# Patient Record
Sex: Male | Born: 1935 | Race: Black or African American | Hispanic: No | Marital: Married | Smoking: Former smoker
Health system: Southern US, Community
[De-identification: ages and names within clinical notes are randomized; demographics above are authoritative.]

## PROBLEM LIST (undated history)

## (undated) DIAGNOSIS — N400 Enlarged prostate without lower urinary tract symptoms: Secondary | ICD-10-CM

## (undated) DIAGNOSIS — Z9109 Other allergy status, other than to drugs and biological substances: Secondary | ICD-10-CM

## (undated) DIAGNOSIS — I10 Essential (primary) hypertension: Secondary | ICD-10-CM

## (undated) HISTORY — PX: CHOLECYSTECTOMY: SHX55

---

## 2004-12-27 ENCOUNTER — Emergency Department: Payer: Self-pay | Admitting: Emergency Medicine

## 2016-01-05 ENCOUNTER — Emergency Department: Payer: Medicare Other

## 2016-01-05 ENCOUNTER — Inpatient Hospital Stay
Admission: EM | Admit: 2016-01-05 | Discharge: 2016-01-15 | DRG: 280 | Disposition: A | Discharge status: Skilled Nursing Facility | Payer: Medicare Other | Attending: Internal Medicine | Admitting: Internal Medicine

## 2016-01-05 DIAGNOSIS — Z79899 Other long term (current) drug therapy: Secondary | ICD-10-CM

## 2016-01-05 DIAGNOSIS — N390 Urinary tract infection, site not specified: Secondary | ICD-10-CM | POA: Diagnosis not present

## 2016-01-05 DIAGNOSIS — I429 Cardiomyopathy, unspecified: Secondary | ICD-10-CM | POA: Diagnosis present

## 2016-01-05 DIAGNOSIS — I110 Hypertensive heart disease with heart failure: Secondary | ICD-10-CM | POA: Diagnosis present

## 2016-01-05 DIAGNOSIS — R748 Abnormal levels of other serum enzymes: Secondary | ICD-10-CM

## 2016-01-05 DIAGNOSIS — R042 Hemoptysis: Secondary | ICD-10-CM | POA: Diagnosis not present

## 2016-01-05 DIAGNOSIS — I472 Ventricular tachycardia, unspecified: Secondary | ICD-10-CM

## 2016-01-05 DIAGNOSIS — I5021 Acute systolic (congestive) heart failure: Secondary | ICD-10-CM | POA: Diagnosis present

## 2016-01-05 DIAGNOSIS — Z87891 Personal history of nicotine dependence: Secondary | ICD-10-CM | POA: Diagnosis not present

## 2016-01-05 DIAGNOSIS — N289 Disorder of kidney and ureter, unspecified: Secondary | ICD-10-CM | POA: Diagnosis not present

## 2016-01-05 DIAGNOSIS — Y731 Therapeutic (nonsurgical) and rehabilitative gastroenterology and urology devices associated with adverse incidents: Secondary | ICD-10-CM | POA: Diagnosis not present

## 2016-01-05 DIAGNOSIS — Z66 Do not resuscitate: Secondary | ICD-10-CM | POA: Diagnosis not present

## 2016-01-05 DIAGNOSIS — N189 Chronic kidney disease, unspecified: Secondary | ICD-10-CM

## 2016-01-05 DIAGNOSIS — Y846 Urinary catheterization as the cause of abnormal reaction of the patient, or of later complication, without mention of misadventure at the time of the procedure: Secondary | ICD-10-CM | POA: Diagnosis not present

## 2016-01-05 DIAGNOSIS — I214 Non-ST elevation (NSTEMI) myocardial infarction: Secondary | ICD-10-CM | POA: Diagnosis present

## 2016-01-05 DIAGNOSIS — N400 Enlarged prostate without lower urinary tract symptoms: Secondary | ICD-10-CM | POA: Diagnosis present

## 2016-01-05 DIAGNOSIS — E785 Hyperlipidemia, unspecified: Secondary | ICD-10-CM | POA: Diagnosis present

## 2016-01-05 DIAGNOSIS — I272 Other secondary pulmonary hypertension: Secondary | ICD-10-CM | POA: Diagnosis present

## 2016-01-05 DIAGNOSIS — D696 Thrombocytopenia, unspecified: Secondary | ICD-10-CM | POA: Diagnosis present

## 2016-01-05 DIAGNOSIS — J9601 Acute respiratory failure with hypoxia: Secondary | ICD-10-CM | POA: Diagnosis not present

## 2016-01-05 DIAGNOSIS — G9340 Encephalopathy, unspecified: Secondary | ICD-10-CM | POA: Diagnosis present

## 2016-01-05 DIAGNOSIS — R739 Hyperglycemia, unspecified: Secondary | ICD-10-CM | POA: Diagnosis present

## 2016-01-05 DIAGNOSIS — Y92009 Unspecified place in unspecified non-institutional (private) residence as the place of occurrence of the external cause: Secondary | ICD-10-CM

## 2016-01-05 DIAGNOSIS — I493 Ventricular premature depolarization: Secondary | ICD-10-CM | POA: Diagnosis present

## 2016-01-05 DIAGNOSIS — I4892 Unspecified atrial flutter: Secondary | ICD-10-CM | POA: Diagnosis present

## 2016-01-05 DIAGNOSIS — R310 Gross hematuria: Secondary | ICD-10-CM | POA: Diagnosis not present

## 2016-01-05 DIAGNOSIS — Z8249 Family history of ischemic heart disease and other diseases of the circulatory system: Secondary | ICD-10-CM

## 2016-01-05 DIAGNOSIS — N179 Acute kidney failure, unspecified: Secondary | ICD-10-CM

## 2016-01-05 DIAGNOSIS — E876 Hypokalemia: Secondary | ICD-10-CM | POA: Diagnosis present

## 2016-01-05 DIAGNOSIS — T502X5A Adverse effect of carbonic-anhydrase inhibitors, benzothiadiazides and other diuretics, initial encounter: Secondary | ICD-10-CM | POA: Diagnosis present

## 2016-01-05 DIAGNOSIS — R0603 Acute respiratory distress: Secondary | ICD-10-CM

## 2016-01-05 DIAGNOSIS — J9600 Acute respiratory failure, unspecified whether with hypoxia or hypercapnia: Secondary | ICD-10-CM | POA: Diagnosis present

## 2016-01-05 DIAGNOSIS — R0602 Shortness of breath: Secondary | ICD-10-CM

## 2016-01-05 DIAGNOSIS — I959 Hypotension, unspecified: Secondary | ICD-10-CM

## 2016-01-05 DIAGNOSIS — J849 Interstitial pulmonary disease, unspecified: Secondary | ICD-10-CM | POA: Diagnosis present

## 2016-01-05 HISTORY — DX: Other allergy status, other than to drugs and biological substances: Z91.09

## 2016-01-05 HISTORY — DX: Essential (primary) hypertension: I10

## 2016-01-05 HISTORY — DX: Benign prostatic hyperplasia without lower urinary tract symptoms: N40.0

## 2016-01-05 LAB — CBC
HEMATOCRIT: 35.4 % — AB (ref 40.0–52.0)
Hemoglobin: 11.4 g/dL — ABNORMAL LOW (ref 13.0–18.0)
MCH: 28.1 pg (ref 26.0–34.0)
MCHC: 32.1 g/dL (ref 32.0–36.0)
MCV: 87.5 fL (ref 80.0–100.0)
PLATELETS: 149 10*3/uL — AB (ref 150–440)
RBC: 4.05 MIL/uL — ABNORMAL LOW (ref 4.40–5.90)
RDW: 15.9 % — AB (ref 11.5–14.5)
WBC: 7.1 10*3/uL (ref 3.8–10.6)

## 2016-01-05 LAB — COMPREHENSIVE METABOLIC PANEL
ALT: 22 U/L (ref 17–63)
ANION GAP: 8 (ref 5–15)
AST: 24 U/L (ref 15–41)
Albumin: 3 g/dL — ABNORMAL LOW (ref 3.5–5.0)
Alkaline Phosphatase: 71 U/L (ref 38–126)
BILIRUBIN TOTAL: 0.8 mg/dL (ref 0.3–1.2)
BUN: 21 mg/dL — AB (ref 6–20)
CHLORIDE: 111 mmol/L (ref 101–111)
CO2: 20 mmol/L — ABNORMAL LOW (ref 22–32)
Calcium: 8.2 mg/dL — ABNORMAL LOW (ref 8.9–10.3)
Creatinine, Ser: 1.47 mg/dL — ABNORMAL HIGH (ref 0.61–1.24)
GFR, EST AFRICAN AMERICAN: 49 mL/min — AB (ref >60)
GFR, EST NON AFRICAN AMERICAN: 42 mL/min — AB (ref >60)
Glucose, Bld: 181 mg/dL — ABNORMAL HIGH (ref 65–99)
POTASSIUM: 2.9 mmol/L — AB (ref 3.5–5.1)
Sodium: 139 mmol/L (ref 135–145)
TOTAL PROTEIN: 6.6 g/dL (ref 6.5–8.1)

## 2016-01-05 LAB — HEPARIN LEVEL (UNFRACTIONATED): Heparin Unfractionated: 0.6 IU/mL (ref 0.30–0.70)

## 2016-01-05 LAB — BLOOD GAS, ARTERIAL
Acid-base deficit: 4.8 mmol/L — ABNORMAL HIGH (ref 0.0–2.0)
Allens test (pass/fail): POSITIVE — AB
BICARBONATE: 20.5 meq/L — AB (ref 21.0–28.0)
FIO2: 1
O2 Saturation: 89.4 %
PO2 ART: 61 mmHg — AB (ref 83.0–108.0)
Patient temperature: 37
pCO2 arterial: 38 mmHg (ref 32.0–48.0)
pH, Arterial: 7.34 — ABNORMAL LOW (ref 7.350–7.450)

## 2016-01-05 LAB — BASIC METABOLIC PANEL
ANION GAP: 7 (ref 5–15)
BUN: 24 mg/dL — ABNORMAL HIGH (ref 6–20)
CALCIUM: 8.2 mg/dL — AB (ref 8.9–10.3)
CO2: 20 mmol/L — ABNORMAL LOW (ref 22–32)
Chloride: 109 mmol/L (ref 101–111)
Creatinine, Ser: 1.87 mg/dL — ABNORMAL HIGH (ref 0.61–1.24)
GFR, EST AFRICAN AMERICAN: 36 mL/min — AB (ref >60)
GFR, EST NON AFRICAN AMERICAN: 31 mL/min — AB (ref >60)
Glucose, Bld: 155 mg/dL — ABNORMAL HIGH (ref 65–99)
Potassium: 4.7 mmol/L (ref 3.5–5.1)
SODIUM: 136 mmol/L (ref 135–145)

## 2016-01-05 LAB — MAGNESIUM
Magnesium: 1.9 mg/dL (ref 1.7–2.4)
Magnesium: 1.9 mg/dL (ref 1.7–2.4)

## 2016-01-05 LAB — APTT: APTT: 28 s (ref 24–36)

## 2016-01-05 LAB — TROPONIN I
TROPONIN I: 0.22 ng/mL — AB (ref <0.031)
TROPONIN I: 3.23 ng/mL — AB (ref <0.031)
Troponin I: 2.16 ng/mL — ABNORMAL HIGH (ref <0.031)

## 2016-01-05 LAB — MRSA PCR SCREENING: MRSA BY PCR: NEGATIVE

## 2016-01-05 LAB — TSH: TSH: 1.713 u[IU]/mL (ref 0.350–4.500)

## 2016-01-05 LAB — GLUCOSE, CAPILLARY: Glucose-Capillary: 144 mg/dL — ABNORMAL HIGH (ref 65–99)

## 2016-01-05 LAB — BRAIN NATRIURETIC PEPTIDE: B NATRIURETIC PEPTIDE 5: 1339 pg/mL — AB (ref 0.0–100.0)

## 2016-01-05 LAB — PROTIME-INR
INR: 1.21
PROTHROMBIN TIME: 15.5 s — AB (ref 11.4–15.0)

## 2016-01-05 MED ORDER — SODIUM CHLORIDE 0.9% FLUSH
3.0000 mL | Freq: Two times a day (BID) | INTRAVENOUS | Status: DC
  Administered 2016-01-05 – 2016-01-15 (×17): 3 mL via INTRAVENOUS

## 2016-01-05 MED ORDER — ACETAMINOPHEN 650 MG RE SUPP: 650.0000 mg | Freq: Four times a day (QID) | RECTAL | Status: DC | PRN

## 2016-01-05 MED ORDER — HEPARIN (PORCINE) IN NACL 100-0.45 UNIT/ML-% IJ SOLN
1000.0000 [IU]/h | INTRAMUSCULAR | Status: DC
  Administered 2016-01-05 – 2016-01-06 (×2): 1000 [IU]/h via INTRAVENOUS
  Filled 2016-01-05 (×5): qty 250

## 2016-01-05 MED ORDER — AMIODARONE HCL IN DEXTROSE 360-4.14 MG/200ML-% IV SOLN
30.0000 mg/h | INTRAVENOUS | Status: DC
  Administered 2016-01-05 – 2016-01-09 (×8): 30 mg/h via INTRAVENOUS
  Filled 2016-01-05 (×16): qty 200

## 2016-01-05 MED ORDER — ASPIRIN EC 325 MG PO TBEC
325.0000 mg | DELAYED_RELEASE_TABLET | Freq: Every day | ORAL | Status: DC
  Administered 2016-01-05 – 2016-01-09 (×5): 325 mg via ORAL
  Filled 2016-01-05 (×5): qty 1

## 2016-01-05 MED ORDER — SODIUM CHLORIDE 0.9% FLUSH: 3.0000 mL | INTRAVENOUS | Status: DC | PRN

## 2016-01-05 MED ORDER — ONDANSETRON HCL 4 MG/2ML IJ SOLN: 4.0000 mg | Freq: Four times a day (QID) | INTRAMUSCULAR | Status: DC | PRN

## 2016-01-05 MED ORDER — HEPARIN BOLUS VIA INFUSION
4000.0000 [IU] | Freq: Once | INTRAVENOUS | Status: AC
  Administered 2016-01-05: 4000 [IU] via INTRAVENOUS
  Filled 2016-01-05: qty 4000

## 2016-01-05 MED ORDER — POTASSIUM CHLORIDE 10 MEQ/100ML IV SOLN
10.0000 meq | Freq: Once | INTRAVENOUS | Status: AC
  Administered 2016-01-05: 10 meq via INTRAVENOUS
  Filled 2016-01-05: qty 100

## 2016-01-05 MED ORDER — CLONIDINE HCL 0.1 MG PO TABS
0.1000 mg | ORAL_TABLET | Freq: Two times a day (BID) | ORAL | Status: DC
  Administered 2016-01-05 – 2016-01-06 (×2): 0.1 mg via ORAL
  Filled 2016-01-05 (×3): qty 1

## 2016-01-05 MED ORDER — AMIODARONE IV BOLUS ONLY 150 MG/100ML
INTRAVENOUS | Status: AC
  Administered 2016-01-05: 150 mg
  Filled 2016-01-05: qty 100

## 2016-01-05 MED ORDER — FUROSEMIDE 10 MG/ML IJ SOLN
60.0000 mg | Freq: Once | INTRAMUSCULAR | Status: AC
  Administered 2016-01-05: 60 mg via INTRAVENOUS
  Filled 2016-01-05: qty 6

## 2016-01-05 MED ORDER — AMIODARONE HCL IN DEXTROSE 360-4.14 MG/200ML-% IV SOLN
60.0000 mg/h | INTRAVENOUS | Status: AC
  Administered 2016-01-05: 60 mg/h via INTRAVENOUS
  Filled 2016-01-05 (×2): qty 200

## 2016-01-05 MED ORDER — DILTIAZEM HCL 25 MG/5ML IV SOLN
15.0000 mg | Freq: Once | INTRAVENOUS | Status: AC
  Administered 2016-01-05: 15 mg via INTRAVENOUS

## 2016-01-05 MED ORDER — HYDRALAZINE HCL 20 MG/ML IJ SOLN
10.0000 mg | INTRAMUSCULAR | Status: DC | PRN
  Administered 2016-01-06 (×2): 10 mg via INTRAVENOUS
  Filled 2016-01-05 (×2): qty 1

## 2016-01-05 MED ORDER — METOPROLOL TARTRATE 1 MG/ML IV SOLN
5.0000 mg | Freq: Once | INTRAVENOUS | Status: AC
  Administered 2016-01-05: 5 mg via INTRAVENOUS

## 2016-01-05 MED ORDER — ACETAMINOPHEN 325 MG PO TABS: 650.0000 mg | ORAL_TABLET | Freq: Four times a day (QID) | ORAL | Status: DC | PRN

## 2016-01-05 MED ORDER — ONDANSETRON HCL 4 MG PO TABS: 4.0000 mg | ORAL_TABLET | Freq: Four times a day (QID) | ORAL | Status: DC | PRN

## 2016-01-05 MED ORDER — AMIODARONE LOAD VIA INFUSION
150.0000 mg | Freq: Once | INTRAVENOUS | Status: AC
  Administered 2016-01-05: 150 mg via INTRAVENOUS

## 2016-01-05 MED ORDER — SODIUM CHLORIDE 0.9 % IV SOLN: 250.0000 mL | INTRAVENOUS | Status: DC | PRN

## 2016-01-05 MED ORDER — HEPARIN (PORCINE) IN NACL 100-0.45 UNIT/ML-% IJ SOLN
12.0000 [IU]/kg/h | Freq: Once | INTRAMUSCULAR | Status: DC
Start: 2016-01-05 — End: 2016-01-05

## 2016-01-05 MED ORDER — SODIUM CHLORIDE 0.9% FLUSH
3.0000 mL | Freq: Two times a day (BID) | INTRAVENOUS | Status: DC
  Administered 2016-01-05: 3 mL via INTRAVENOUS

## 2016-01-05 MED ORDER — METOPROLOL TARTRATE 1 MG/ML IV SOLN
INTRAVENOUS | Status: AC
  Administered 2016-01-05: 5 mg via INTRAVENOUS
  Filled 2016-01-05: qty 5

## 2016-01-05 MED ORDER — DILTIAZEM HCL 25 MG/5ML IV SOLN
INTRAVENOUS | Status: AC
  Administered 2016-01-05: 15 mg via INTRAVENOUS
  Filled 2016-01-05: qty 5

## 2016-01-05 MED ORDER — DEXTROSE 5 % IV SOLN: 60.0000 mg/h | Freq: Once | INTRAVENOUS | Status: DC

## 2016-01-05 NOTE — ED Notes (Addendum)
Pt shocked 100 J. HR now 73.

## 2016-01-05 NOTE — ED Provider Notes (Signed)
[facility] Emergency Department Provider Note ___________________________________________  Time seen: Approximately 9:16 AM  I have reviewed the triage vital signs and the nursing notes.   HISTORY  Chief Complaint Tachycardia  HPI Jermaine Fields is a 82 y.o. male who was brought in per EMS with rapid heart rate. Patient's heart rate was close to 200 per EMS and he was brought in emergency traffic. Patient was not having any significant chest pain or respiratory distress associated with it. Patient states that he has been told before that his heart is rapid but he was not sure if he had history of atrial fibrillation or SVT. Patient denies any significant recent illnesses, cough, shortness of breath, nausea, vomiting, diarrhea, rash or any other symptoms. Patient denies any pain.   Past Medical History  Diagnosis Date  . Hypertension     There are no active problems to display for this patient.   Past Surgical History  Procedure Laterality Date  . Cholecystectomy      No current outpatient prescriptions on file.  Allergies Review of patient's allergies indicates no known allergies.  History reviewed. No pertinent family history.  Social History Social History  Substance Use Topics  . Smoking status: Never Smoker   . Smokeless tobacco: None  . Alcohol Use: No    Review of Systems Constitutional: No fever/chills Eyes: No visual changes. ENT: No sore throat. Cardiovascular: Denies chest pain.Patient brought in emergency traffic for rapid heart rate. Respiratory: Denies shortness of breath. Gastrointestinal: No abdominal pain.  No nausea, no vomiting.  No diarrhea.  No constipation. Genitourinary: Negative for dysuria. Musculoskeletal: Negative for back pain. Skin: Negative for rash. Neurological: Negative for headaches, focal weakness or numbness.  10-point ROS otherwise negative.  ____________________________________________   PHYSICAL  EXAM:  VITAL SIGNS: ED Triage Vitals  Enc Vitals Group     BP --      Pulse --      Resp --      Temp --      Temp src --      SpO2 --      Weight --      Height --      Head Cir --      Peak Flow --      Pain Score --      Pain Loc --      Pain Edu? --      Excl. in GC? --     Constitutional: Alert and oriented. Well appearing and in no acute distress. Eyes: Conjunctivae are normal. PERRL. EOMI. Head: Atraumatic. Nose: No congestion/rhinnorhea. Mouth/Throat: Mucous membranes are moist.  Oropharynx non-erythematous. Neck: No stridor.   Cardiovascular: Patient with rapid heart rate 187, 188. Grossly normal heart sounds.  Good peripheral circulation. Respiratory: Normal respiratory effort.  No retractions. Lungs CTAB. Gastrointestinal: Soft and nontender. No distention. No abdominal bruits. No CVA tenderness. Musculoskeletal: No lower extremity tenderness but positive for 3+ edema.  No joint effusions. Neurologic:  Normal speech and language. No gross focal neurologic deficits are appreciated. No gait instability. Skin:  Skin is warm, dry and intact. No rash noted. Psychiatric: Mood and affect are normal. Speech and behavior are normal.  ____________________________________________   LABS (all labs ordered are listed, but only abnormal results are displayed)  Labs Reviewed  CBC - Abnormal; Notable for the following:    RBC 4.05 (*)    Hemoglobin 11.4 (*)    HCT 35.4 (*)    RDW 15.9 (*)  Platelets 149 (*)    All other components within normal limits  COMPREHENSIVE METABOLIC PANEL - Abnormal; Notable for the following:    Potassium 2.9 (*)    CO2 20 (*)    Glucose, Bld 181 (*)    BUN 21 (*)    Creatinine, Ser 1.47 (*)    Calcium 8.2 (*)    Albumin 3.0 (*)    GFR calc non Af Amer 42 (*)    GFR calc Af Amer 49 (*)    All other components within normal limits  TROPONIN I - Abnormal; Notable for the following:    Troponin I 0.22 (*)    All other components  within normal limits  BRAIN NATRIURETIC PEPTIDE - Abnormal; Notable for the following:    B Natriuretic Peptide 1339.0 (*)    All other components within normal limits  BLOOD GAS, ARTERIAL - Abnormal; Notable for the following:    pH, Arterial 7.34 (*)    pO2, Arterial 61 (*)    Bicarbonate 20.5 (*)    Acid-base deficit 4.8 (*)    Allens test (pass/fail) POSITIVE (*)    All other components within normal limits  URINALYSIS COMPLETEWITH MICROSCOPIC ([facility] ONLY)   ____________________________________________  EKG ED ECG REPORT I, Leona Carry, the attending physician, personally viewed and interpreted this ECG.   Date: 01/05/2016  EKG Time: 939  Rate: 188  Rhythm: Wide complex tachycardia versus ventricular tachycardia  Axis: Left axis deviation  Intervals:Unable to evaluate  ST&T Change: Nonspecific ST and T-wave changes  ED ECG REPORT I, Leona Carry, the attending physician, personally viewed and interpreted this ECG. This EKG is post-cardioversion   Date: 01/05/2016  EKG Time: 950  Rate: 86  Rhythm: Appears to be sinus with frequent PVCs and frequently in a bigeminy pattern  Axis: Normal  Intervals:left bundle branch block  ST&T Change: Nonspecific ST and T-wave changes  ____________________________________________  RADIOLOGY Dg Chest 1 View  01/05/2016  CLINICAL DATA:  Weakness and dizziness. [name] out of his chair this morning. EXAM: CHEST 1 VIEW COMPARISON:  None. FINDINGS: Enlarged cardiac silhouette. Prominent interstitial markings. Normal vasculature. No pleural fluid. Diffuse osteopenia. No fracture or pneumothorax seen. IMPRESSION: Cardiomegaly and chronic interstitial lung disease. Electronically Signed   By: Beckie Salts M.D.   On: 01/05/2016 10:12   Ct Head Wo Contrast  01/05/2016  CLINICAL DATA:  Altered mental status. Weakness and dizziness. Tachycardia. EXAM: CT HEAD WITHOUT CONTRAST TECHNIQUE: Contiguous axial images were obtained from the base of  the skull through the vertex without intravenous contrast. COMPARISON:  Report from 10/03/2001 head CT (images not available). FINDINGS: No evidence of parenchymal hemorrhage or extra-axial fluid collection. No mass lesion, mass effect, or midline shift. No CT evidence of acute infarction. Intracranial atherosclerosis. Nonspecific mild subcortical and periventricular white matter hypodensity, most in keeping with chronic small vessel ischemic change. Small bilateral thalamic lacunar infarcts. Cerebral volume is age appropriate. No ventriculomegaly. The visualized paranasal sinuses are essentially clear. The mastoid air cells are unopacified. No evidence of calvarial fracture. IMPRESSION: 1.  No evidence of acute intracranial abnormality. 2. Intracranial atherosclerosis, mild chronic small vessel ischemia and small bilateral thalamic lacunes. Electronically Signed   By: Delbert Phenix M.D.   On: 01/05/2016 11:07    ____________________________________________   PROCEDURES  Procedure(s) performed: Synchronize cardioversion at 100 J for ventricular tachycardia  Critical Care performed: Yes, see critical care note(s) CRITICAL CARE Performed by: Leona Carry   Total  critical care time: 45 minutes  Critical care time was exclusive of separately billable procedures and treating other patients.  Critical care was necessary to treat or prevent imminent or life-threatening deterioration.  Critical care was time spent personally by me on the following activities: development of treatment plan with patient and/or surrogate as well as nursing, discussions with consultants, evaluation of patient's response to treatment, examination of patient, obtaining history from patient or surrogate, ordering and performing treatments and interventions, ordering and review of laboratory studies, ordering and review of radiographic studies, pulse oximetry and re-evaluation of patient's  condition.  ____________________________________________   INITIAL IMPRESSION / ASSESSMENT AND PLAN / ED COURSE  Pertinent labs & imaging results that were available during my care of the patient were reviewed by me and considered in my medical decision making (see chart for details).  11:40 AM Patient will be given IV Cardizem and IV fluids at this time. Patient will have routine labs and x-rays.  11:40 AM 50 mg IV Cardizem did not convert patient's rhythm. He was also given 5 mg of Lopressor IV which did not convert his rhythm as well. Patient started becoming altered, had a questionable seizure, and became hypotensive. It was decided at that time to synchronize cardioverted this patient with 100 J. After cardioversion patient went into a bigeminy rhythm at a rate of 64, and his blood pressure much improved. It was decided at that time to give him a bolus IV amiodarone and started amiodarone infusion for possible ventricular tachycardia. We'll get a repeat EKG post-cardioversion as well as a CT head for his confusion and possible seizure.  11:40 AM Patient has been stable and in persistent bigeminy since cardioversion. Patient's initial troponin was also elevated. Dr. Welton FlakesKhan was consulted for cardiology evaluation. CT head and chest x-ray did not show any acute findings. The hospitalist though is going to admit the patient. Dr. Welton FlakesKhan from cardiology requested that we start the patient on IV heparin bolus and infusions and he is going to take him to the catheter lab tomorrow. ____________________________________________   FINAL CLINICAL IMPRESSION(S) / ED DIAGNOSES  Final diagnoses:  Ventricular tachycardia (HCC)  Hypotension, unspecified hypotension type  Cardiac enzymes elevated  Hypokalemia       Leona CarryLinda M Miche Loughridge, MD 01/05/16 1140

## 2016-01-05 NOTE — ED Notes (Signed)
Pt satting 91% 4 L [place]. Dr. Allena KatzPatel notified and is okay with oxygen saturation.

## 2016-01-05 NOTE — Progress Notes (Signed)
eLink Physician-Brief Progress Note Patient Name: Jermaine GuileWillie Fields DOB: 03/26/1930 MRN: [id number]   Date of Service  01/05/2016  HPI/Events of Note  Hypoxia - Decreased sats requiring high flow FM O2. Hx of chronic interstitial lung disease. BP = 158/60. New PCCM consult.   eICU Interventions  Will order: 1. Lasix 60 mg IV X 1 now.      Intervention Category Major Interventions: Hypoxemia - evaluation and management  Lenell AntuSommer,Charonda Hefter Eugene 01/05/2016, 6:40 PM

## 2016-01-05 NOTE — ED Notes (Addendum)
Pts blood pressure 86/75. Pt became diaphoretic. Pt alert, became disoriented. MD notified. Ordered shock.

## 2016-01-05 NOTE — ED Notes (Signed)
Pt alert and oriented. Family at bedside. Pt given snack.

## 2016-01-05 NOTE — ED Notes (Signed)
Pt taken off non rebreather. Placed on 4 L [place]. Satting 97% r/a

## 2016-01-05 NOTE — Progress Notes (Signed)
ANTICOAGULATION CONSULT NOTE - Initial Consult  Pharmacy Consult for Heparin  Indication: chest pain/ACS  Allergies  Allergen Reactions  . Vitamin D2 [Ergocalciferol] Other (See Comments)    This medication made the patient too hot.    Patient Measurements: Height: 5\' 11"  (180.3 cm) Weight: 207 lb 7.3 oz (94.1 kg) IBW/kg (Calculated) : 75.3 Heparin Dosing Weight: 81.6 kg   Vital Signs: Temp: 98 F (36.7 C) (04/16 2000) Temp Source: Oral (04/16 2000) BP: 157/106 mmHg (04/16 1900) Pulse Rate: 94 (04/16 1900)  Labs:  Recent Labs  01/05/16 0920 01/05/16 1650 01/05/16 1957  HGB 11.4*  --   --   HCT 35.4*  --   --   PLT 149*  --   --   APTT 28  --   --   LABPROT 15.5*  --   --   INR 1.21  --   --   HEPARINUNFRC  --   --  0.60  CREATININE 1.47* 1.87*  --   TROPONINI 0.22* 2.16*  --     Estimated Creatinine Clearance: 34.4 mL/min (by C-G formula based on Cr of 1.87).   Medical History: Past Medical History  Diagnosis Date  . Hypertension   . BPH (benign prostatic hyperplasia)   . Environmental allergies     Medications:  Prescriptions prior to admission  Medication Sig Dispense Refill Last Dose  . amLODipine (NORVASC) 10 MG tablet Take 10 mg by mouth daily.   01/04/2016 at Unknown time  . aspirin EC 81 MG tablet Take 81 mg by mouth daily.   01/04/2016 at Unknown time  . cetirizine (ZYRTEC) 10 MG tablet Take 10 mg by mouth daily.   01/04/2016 at Unknown time  . cloNIDine (CATAPRES) 0.1 MG tablet Take 0.1 mg by mouth 2 (two) times daily.   01/04/2016 at Unknown time  . doxazosin (CARDURA) 2 MG tablet Take 2 mg by mouth daily.   01/04/2016 at Unknown time  . fluticasone (FLONASE) 50 MCG/ACT nasal spray Place 2 sprays into both nostrils daily as needed. For sinus congestion.   01/04/2016 at Unknown time  . lisinopril (PRINIVIL,ZESTRIL) 40 MG tablet Take 40 mg by mouth daily.   01/04/2016 at Unknown time    Assessment: Pharmacy consulted to dose heparin in this 82 year  old male admitted with elevated cardiac enzymes.  CrCl = 39.8 ml/min, No prior anticoag noted.   Goal of Therapy:  Heparin level 0.3-0.7 units/ml Monitor platelets by anticoagulation protocol: Yes   Plan:  Give 4000 units bolus x 1 Start heparin infusion at 1000 units/hr  Will order baseline aPTT and INR.  Will draw 1st HL 8 hrs after start of drip on 4/16 @ 20:00.   4/16: HL @ 20:00 = 0.6 Will continue this pt on current rate of 1000 units/hr and recheck HL in 8 hrs on 4/17 @ 4:00.  Nachmen Mansel D 01/05/2016,8:35 PM

## 2016-01-05 NOTE — Progress Notes (Signed)
Jermaine Fields is a 82 y.o. male  [id number]  Primary Cardiologist: Adrian Blackwater Reason for Consultation: Ventricular tachycardia and presyncope  HPI: 82 year old African-American male with a past medical history of hypertension presented to the hospital almost certain after he started having chest pain and dizziness leading to her falling down. According to the wife after he fell down he did not pass out. But he was holding his chest and ambulance was called and was seen in the emergency room where he was given adenosine and metoprolol several time for a rhythm which was thought to be initially SVT. Since it did not convert to sinus rhythm patient was electrically cardioverted. He is right now in sinus rhythm. His original rhythm was ventricular tachycardia at 188 bpm.   Review of Systems: No chest pain at this time but feels very short of breath   Past Medical History  Diagnosis Date  . Hypertension      (Not in a hospital admission)   . heparin  12 Units/kg/hr Intravenous Once    Infusions: . amiodarone 60 mg/hr (01/05/16 1026)  . amiodarone    . potassium chloride      No Known Allergies  Social History   Social History  . Marital Status: Married    Spouse Name: N/A  . Number of Children: N/A  . Years of Education: N/A   Occupational History  . Not on file.   Social History Main Topics  . Smoking status: Never Smoker   . Smokeless tobacco: Not on file  . Alcohol Use: No  . Drug Use: No  . Sexual Activity: Not on file   Other Topics Concern  . Not on file   Social History Narrative  . No narrative on file    History reviewed. No pertinent family history.  PHYSICAL EXAM: Filed Vitals:   01/05/16 1115 01/05/16 1130  BP: 112/51 122/63  Pulse: 78 77  Temp:    Resp: 23 18    No intake or output data in the 24 hours ending 01/05/16 1144  General:  Well appearing. No respiratory difficulty HEENT: normal Neck: supple. no JVD. Carotids 2+ bilat; no  bruits. No lymphadenopathy or thryomegaly appreciated. Cor: PMI nondisplaced. Regular rate & rhythm. No rubs, gallops or murmurs. Lungs: clear Abdomen: soft, nontender, nondistended. No hepatosplenomegaly. No bruits or masses. Good bowel sounds. Extremities: no cyanosis, clubbing, rash, edema Neuro: alert & oriented x 3, cranial nerves grossly intact. moves all 4 extremities w/o difficulty. Affect pleasant.  NFA:OZHYQ rhythm with couplets and frequent PVCs and poor R-wave progression suggestive of old anteroseptal wall MI  Results for orders placed or performed during the hospital encounter of 01/05/16 (from the past 24 hour(s))  CBC     Status: Abnormal   Collection Time: 01/05/16  9:20 AM  Result Value Ref Range   WBC 7.1 3.8 - 10.6 K/uL   RBC 4.05 (L) 4.40 - 5.90 MIL/uL   Hemoglobin 11.4 (L) 13.0 - 18.0 g/dL   HCT 65.7 (L) 84.6 - 96.2 %   MCV 87.5 80.0 - 100.0 fL   MCH 28.1 26.0 - 34.0 pg   MCHC 32.1 32.0 - 36.0 g/dL   RDW 95.2 (H) 84.1 - 32.4 %   Platelets 149 (L) 150 - 440 K/uL  Comprehensive metabolic panel     Status: Abnormal   Collection Time: 01/05/16  9:20 AM  Result Value Ref Range   Sodium 139 135 - 145 mmol/L   Potassium 2.9 (LL) 3.5 -  5.1 mmol/L   Chloride 111 101 - 111 mmol/L   CO2 20 (L) 22 - 32 mmol/L   Glucose, Bld 181 (H) 65 - 99 mg/dL   BUN 21 (H) 6 - 20 mg/dL   Creatinine, Ser 1.32 (H) 0.61 - 1.24 mg/dL   Calcium 8.2 (L) 8.9 - 10.3 mg/dL   Total Protein 6.6 6.5 - 8.1 g/dL   Albumin 3.0 (L) 3.5 - 5.0 g/dL   AST 24 15 - 41 U/L   ALT 22 17 - 63 U/L   Alkaline Phosphatase 71 38 - 126 U/L   Total Bilirubin 0.8 0.3 - 1.2 mg/dL   GFR calc non Af Amer 42 (L) >60 mL/min   GFR calc Af Amer 49 (L) >60 mL/min   Anion gap 8 5 - 15  Troponin I     Status: Abnormal   Collection Time: 01/05/16  9:20 AM  Result Value Ref Range   Troponin I 0.22 (H) <0.031 ng/mL  Brain natriuretic peptide     Status: Abnormal   Collection Time: 01/05/16  9:20 AM  Result Value Ref  Range   B Natriuretic Peptide 1339.0 (H) 0.0 - 100.0 pg/mL  Blood gas, arterial ([facility] & [facility] ONLY)     Status: Abnormal   Collection Time: 01/05/16  9:50 AM  Result Value Ref Range   FIO2 1.00    Delivery systems NON-REBREATHER OXYGEN MASK    pH, Arterial 7.34 (L) 7.350 - 7.450   pCO2 arterial 38 32.0 - 48.0 mmHg   pO2, Arterial 61 (L) 83.0 - 108.0 mmHg   Bicarbonate 20.5 (L) 21.0 - 28.0 mEq/L   Acid-base deficit 4.8 (H) 0.0 - 2.0 mmol/L   O2 Saturation 89.4 %   Patient temperature 37.0    Collection site LEFT BRACHIAL    Sample type ARTERIAL DRAW    Allens test (pass/fail) POSITIVE (A) PASS   Dg Chest 1 View  01/05/2016  CLINICAL DATA:  Weakness and dizziness. Jermaine Fields out of his chair this morning. EXAM: CHEST 1 VIEW COMPARISON:  None. FINDINGS: Enlarged cardiac silhouette. Prominent interstitial markings. Normal vasculature. No pleural fluid. Diffuse osteopenia. No fracture or pneumothorax seen. IMPRESSION: Cardiomegaly and chronic interstitial lung disease. Electronically Signed   By: Beckie Salts M.D.   On: 01/05/2016 10:12   Ct Head Wo Contrast  01/05/2016  CLINICAL DATA:  Altered mental status. Weakness and dizziness. Tachycardia. EXAM: CT HEAD WITHOUT CONTRAST TECHNIQUE: Contiguous axial images were obtained from the base of the skull through the vertex without intravenous contrast. COMPARISON:  Report from 10/03/2001 head CT (images not available). FINDINGS: No evidence of parenchymal hemorrhage or extra-axial fluid collection. No mass lesion, mass effect, or midline shift. No CT evidence of acute infarction. Intracranial atherosclerosis. Nonspecific mild subcortical and periventricular white matter hypodensity, most in keeping with chronic small vessel ischemic change. Small bilateral thalamic lacunar infarcts. Cerebral volume is age appropriate. No ventriculomegaly. The visualized paranasal sinuses are essentially clear. The mastoid air cells are unopacified. No evidence of calvarial  fracture. IMPRESSION: 1.  No evidence of acute intracranial abnormality. 2. Intracranial atherosclerosis, mild chronic small vessel ischemia and small bilateral thalamic lacunes. Electronically Signed   By: Delbert Phenix M.D.   On: 01/05/2016 11:07     ASSESSMENT AND PLAN: Status post electrical cardioversion of sustained ventricular tachycardia. Strips of ventricular tachycardia appears to show monomorphic VT. Patient had chest pain with it and presyncope. This mildly elevated troponin and EKG has no ST elevation. Patient is right  now comfortable and has no chest pain. Advise starting the patient on IV amiodarone after a loading dose and also start the patient on aspirin and heparin. Plan on doing cardiac catheterization first thing in morning.  Sign Breon Rehm [facility] Inaki Vantine

## 2016-01-05 NOTE — Progress Notes (Addendum)
ANTICOAGULATION CONSULT NOTE - Initial Consult  Pharmacy Consult for Heparin  Indication: chest pain/ACS  No Known Allergies  Patient Measurements: Height: 5\' 11"  (180.3 cm) Weight: 180 lb (81.647 kg) IBW/kg (Calculated) : 75.3 Heparin Dosing Weight: 81.6 kg   Vital Signs: Temp: 98.1 F (36.7 C) (04/16 0914) BP: 122/63 mmHg (04/16 1130) Pulse Rate: 77 (04/16 1130)  Labs:  Recent Labs  01/05/16 0920  HGB 11.4*  HCT 35.4*  PLT 149*  CREATININE 1.47*  TROPONINI 0.22*    Estimated Creatinine Clearance: 39.8 mL/min (by C-G formula based on Cr of 1.47).   Medical History: Past Medical History  Diagnosis Date  . Hypertension     Medications:   (Not in a hospital admission)  Assessment: Pharmacy consulted to dose heparin in this 82 year old male admitted with elevated cardiac enzymes.  CrCl = 39.8 ml/min, No prior anticoag noted.   Goal of Therapy:  Heparin level 0.3-0.7 units/ml Monitor platelets by anticoagulation protocol: Yes   Plan:  Give 4000 units bolus x 1 Start heparin infusion at 1000 units/hr  Will order baseline aPTT and INR.  Will draw 1st HL 8 hrs after start of drip on 4/16 @ 20:00.   Leane Loring D 01/05/2016,12:05 PM

## 2016-01-05 NOTE — H&P (Signed)
[facility] - [place] at [facility]   PATIENT NAME: Jermaine Fields    MR#:  [id number]  DATE OF BIRTH:  04/14/1930  DATE OF ADMISSION:  01/05/2016  PRIMARY CARE PHYSICIAN: Oswaldo ConroyBender, Abby Daneele, MD   REQUESTING/REFERRING PHYSICIAN: Suella BroadLinda Taylor MD  CHIEF COMPLAINT:   Chief Complaint  Patient presents with  . Tachycardia    HISTORY OF PRESENT ILLNESS: Jermaine GuileWillie Fields  is a 82 y.o. male with a known history of hypertension, BPH, environmental allergies who presents with not feeling well and his heart beating fast. Patient apparently was having chest pain and dizziness earlier this morning. Patient did fall down. He was having chest pain. On his way to the hospital patient was noted to have SVT which was later reviewed by cardiology and felt to be ventricular tachycardia. He received multiple doses of adenosine and metoprolol heart rate continued to stay elevated. Patient had a episode where he became unresponsive. Therefore he was cardioverted. He is started on amiodarone drip. Currently he is on a full face mask. He denies currently chest pain. He does complain of recent cough with productive sputum. Also dyspnea on exertion.   PAST MEDICAL HISTORY:   Past Medical History  Diagnosis Date  . Hypertension   . BPH (benign prostatic hyperplasia)   . Environmental allergies     PAST SURGICAL HISTORY: Past Surgical History  Procedure Laterality Date  . Cholecystectomy      SOCIAL HISTORY:  Social History  Substance Use Topics  . Smoking status: Former Games developermoker  . Smokeless tobacco: Not on file  . Alcohol Use: No    FAMILY HISTORY:  Family History  Problem Relation Age of Onset  . Hypertension      DRUG ALLERGIES: No Known Allergies  REVIEW OF SYSTEMS:   CONSTITUTIONAL: No fever, positive fatigue and weakness.  EYES: No blurred or double vision.  EARS, NOSE, AND THROAT: No tinnitus or ear pain.  RESPIRATORY: Productive cough, positive shortness of  breath, wheezing or hemoptysis.  CARDIOVASCULAR: Positive chest pain, orthopnea, edema.  GASTROINTESTINAL: No nausea, vomiting, diarrhea or abdominal pain.  GENITOURINARY: No dysuria, hematuria.  ENDOCRINE: No polyuria, nocturia,  HEMATOLOGY: No anemia, easy bruising or bleeding SKIN: No rash or lesion. MUSCULOSKELETAL: No joint pain or arthritis.   NEUROLOGIC: No tingling, numbness, weakness.  PSYCHIATRY: No anxiety or depression.   MEDICATIONS AT HOME:  Amlodipine 5 mg daily Doxazosin 2 mg 1 tab daily at bedtime Fluticasone nasal spray daily when necessary Lisinopril 40 daily Clonidine 0.1 one tab by mouth twice a day cetirizine 10 mg 1 tab by mouth daily  PHYSICAL EXAMINATION:   VITAL SIGNS: Blood pressure 122/63, pulse 77, temperature 98.1 F (36.7 C), resp. rate 18, height 5\' 11"  (1.803 m), weight 81.647 kg (180 lb), SpO2 100 %.  GENERAL:  82 y.o.-year-old patient lying in the bed with a fullface mask EYES: Pupils equal, round, reactive to light and accommodation. No scleral icterus. Extraocular muscles intact.  HEENT: Head atraumatic, normocephalic. Oropharynx and nasopharynx clear.  NECK:  Supple, no jugular venous distention. No thyroid enlargement, no tenderness.  LUNGS: Normal breath sounds bilaterally, no wheezing, rales,rhonchi or crepitation. No use of accessory muscles of respiration.  CARDIOVASCULAR: S1, S2 normal. No murmurs, rubs, or gallops.  ABDOMEN: Soft, nontender, nondistended. Bowel sounds present. No organomegaly or mass.  EXTREMITIES: No pedal edema, cyanosis, or clubbing.  NEUROLOGIC: Cranial nerves II through XII are intact. Muscle strength 5/5 in all extremities. Sensation intact. Gait not checked.  PSYCHIATRIC: The patient is alert and oriented x 3.  SKIN: No obvious rash, lesion, or ulcer.   LABORATORY PANEL:   CBC  Recent Labs Lab 01/05/16 0920  WBC 7.1  HGB 11.4*  HCT 35.4*  PLT 149*  MCV 87.5  MCH 28.1  MCHC 32.1  RDW 15.9*    ------------------------------------------------------------------------------------------------------------------  Chemistries   Recent Labs Lab 01/05/16 0920  NA 139  K 2.9*  CL 111  CO2 20*  GLUCOSE 181*  BUN 21*  CREATININE 1.47*  CALCIUM 8.2*  AST 24  ALT 22  ALKPHOS 71  BILITOT 0.8   ------------------------------------------------------------------------------------------------------------------ estimated creatinine clearance is 39.8 mL/min (by C-G formula based on Cr of 1.47). ------------------------------------------------------------------------------------------------------------------ No results for input(s): TSH, T4TOTAL, T3FREE, THYROIDAB in the last 72 hours.  Invalid input(s): FREET3   Coagulation profile No results for input(s): INR, PROTIME in the last 168 hours. ------------------------------------------------------------------------------------------------------------------- No results for input(s): DDIMER in the last 72 hours. -------------------------------------------------------------------------------------------------------------------  Cardiac Enzymes  Recent Labs Lab 01/05/16 0920  TROPONINI 0.22*   ------------------------------------------------------------------------------------------------------------------ Invalid input(s): POCBNP  ---------------------------------------------------------------------------------------------------------------  Urinalysis No results found for: COLORURINE, APPEARANCEUR, LABSPEC, PHURINE, GLUCOSEU, HGBUR, BILIRUBINUR, KETONESUR, PROTEINUR, UROBILINOGEN, NITRITE, LEUKOCYTESUR   RADIOLOGY: Dg Chest 1 View  01/05/2016  CLINICAL DATA:  Weakness and dizziness. [name] out of his chair this morning. EXAM: CHEST 1 VIEW COMPARISON:  None. FINDINGS: Enlarged cardiac silhouette. Prominent interstitial markings. Normal vasculature. No pleural fluid. Diffuse osteopenia. No fracture or pneumothorax seen.  IMPRESSION: Cardiomegaly and chronic interstitial lung disease. Electronically Signed   By: Beckie Salts M.D.   On: 01/05/2016 10:12   Ct Head Wo Contrast  01/05/2016  CLINICAL DATA:  Altered mental status. Weakness and dizziness. Tachycardia. EXAM: CT HEAD WITHOUT CONTRAST TECHNIQUE: Contiguous axial images were obtained from the base of the skull through the vertex without intravenous contrast. COMPARISON:  Report from 10/03/2001 head CT (images not available). FINDINGS: No evidence of parenchymal hemorrhage or extra-axial fluid collection. No mass lesion, mass effect, or midline shift. No CT evidence of acute infarction. Intracranial atherosclerosis. Nonspecific mild subcortical and periventricular white matter hypodensity, most in keeping with chronic small vessel ischemic change. Small bilateral thalamic lacunar infarcts. Cerebral volume is age appropriate. No ventriculomegaly. The visualized paranasal sinuses are essentially clear. The mastoid air cells are unopacified. No evidence of calvarial fracture. IMPRESSION: 1.  No evidence of acute intracranial abnormality. 2. Intracranial atherosclerosis, mild chronic small vessel ischemia and small bilateral thalamic lacunes. Electronically Signed   By: Delbert Phenix M.D.   On: 01/05/2016 11:07    EKG: Orders placed or performed during the hospital encounter of 01/05/16  . ED EKG  . ED EKG    IMPRESSION AND PLAN: Patient is a 82 year old African-American male with ventricular tachycardia  1. Ventricular tachycardia; continue amiodarone drip Replace potassium I will check magnesium I will check TSH Cardiology artery has seen the patient Obtain echocardiogram of the heart Heparin drip for possible ischemia follow troponin levels  2. Hypokalemia we'll check magnesium level IV potassium has been given will recheck potassium later today continue to replace  3. BPH will continue doxazosin  4. Acute respiratory failure requiring high flow oxygen.  Recent symptoms consistent with acute bronchitis we'll treat with antibiotics now last pulmonary to see if does not improve may need CT of the chest  5. Hypertension we'll hold blood pressure medications in light of his blood pressure being borderline  6. Miscellaneous DVT prophylaxis patient RD on heparin   All the records are  reviewed and case discussed with ED provider. Management plans discussed with the patient, family and they are in agreement.  CODE STATUS: Full    Code Status Orders        Start     Ordered   01/05/16 1200  Full code   Continuous     01/05/16 1201    Code Status History    Date Active Date Inactive Code Status Order ID Comments User Context   This patient has a current code status but no historical code status.       TOTAL TIME TAKING CARE OF THIS PATIENT: 55 minutes critical care time.    Auburn Bilberry M.D on 01/05/2016 at 12:07 PM  Between 7am to 6pm - Pager - [phone]  After 6pm go to [email or web address] - password [email or web address] [facility]  [facility]  Office  [phone]  CC: Primary care physician; Oswaldo Conroy, MD

## 2016-01-05 NOTE — ED Notes (Signed)
Potassium 2.9. Troponin 0.22. MD notified.

## 2016-01-05 NOTE — ED Notes (Signed)
Pt came to ED via EMS. Pt reports feeling weak and dizzy and falling out of his chair this morning. Upon EMS arrival, HR was 210. Pt was in wide complex tachycardia. EMS gave pt 6mg  adenosine with no change. EMS then gave pt 12 of adenosine with no response. Pt given 450mg  amiodorone per EMS. Pt HR 190 upon arrival.Pt alert and oriented. Pt satting 84 r/a per EMS. Placed on 4 L [place] and satting 99% r/a.

## 2016-01-05 NOTE — ED Notes (Signed)
Pt assisted on bedpan.

## 2016-01-05 NOTE — ED Notes (Signed)
Pt alert and oriented. Pt able to state name, month, where he is. Pt reports he is sleepy. Denies pain.

## 2016-01-05 NOTE — Consult Note (Signed)
PULMONARY / CRITICAL CARE MEDICINE   Name: Jermaine Fields MRN: [id number] DOB: 08-14-1930    ADMISSION DATE:  01/05/2016   CONSULTATION DATE:  01/05/2016  REFERRING MD:  ED  CHIEF COMPLAINT:  Ventricular tachycardia, presyncope and dyspnea  HISTORY OF PRESENT ILLNESS: [name] is an 82 year old African-American male with a past medical history of hypertension, and benign prostatic hypertrophy who presented to the emergency room after having a near syncopal episode at home as well as chest pain. Patient states that it all started with dizziness and felt as if his heart was racing. He described a mild to moderate chest pain, mostly localized on the left side, stating and nonradicular. Apparently patient fell at home but did not lose consciousness. His wife then called EMS and he was brought to the emergency room. At the emergency room, patient was still complaining of chest pain. Was placed on telemetry. His rhythm was sustained ventricular tachycardia with rates in the 180s. He was given adenosine and metoprolol without effect. Hence, he was electrically had take. He is now in sinus rhythm fluctuating into sinus tach with rates in the 90s to low 100s. His oxygen saturation was in the 80s and he was placed on a nonrebreather mask. He is currently saturating at 95-100%. He denies chest pain, pain, dizziness, headache, nausea and vomiting but reports occasional feelings of a racing heart and shortness of breath. He denies having similar symptoms in the past. He is due for a cardiac catheterization tomorrow.   PAST MEDICAL HISTORY :  He  has a past medical history of Hypertension; BPH (benign prostatic hyperplasia); and Environmental allergies.  PAST SURGICAL HISTORY: He  has past surgical history that includes Cholecystectomy.  Allergies  Allergen Reactions  . Vitamin D2 [Ergocalciferol] Other (See Comments)    This medication made the patient too hot.    No current facility-administered  medications on file prior to encounter.   No current outpatient prescriptions on file prior to encounter.    FAMILY HISTORY:  His has no family status information on file.   SOCIAL HISTORY: He  reports that he has quit smoking. He does not have any smokeless tobacco history on file. He reports that he does not drink alcohol or use illicit drugs.  REVIEW OF SYSTEMS:   Constitutional: Reports generalized weakness but denies fever and chills HENT: Negative for congestion and rhinorrhea.  Eyes: Negative for redness and visual disturbance.  Respiratory: Positive for shortness of breath but negative for wheezing.  Cardiovascular: Positive for chest pain, dizziness and palpitations.  Gastrointestinal: Negative  for nausea , vomiting and abdominal pain and loose stools Genitourinary: Negative for dysuria and urgency.  Musculoskeletal: Negative for myalgias and arthralgias but positive for gait instability and falls.  Skin: Negative for pallor and wound.  Neurological: Positive for dizziness but negative for headaches   SUBJECTIVE:   VITAL SIGNS: BP 157/106 mmHg  Pulse 94  Temp(Src) 98.1 F (36.7 C) (Oral)  Resp 19  Ht  (1.803 m)  Wt 207 lb 7.3 oz (94.1 kg)  BMI 28.95 kg/m2  SpO2 91%  HEMODYNAMICS:    VENTILATOR SETTINGS: Vent Mode:  [-]  FiO2 (%):  [100 %] 100 %  INTAKE / OUTPUT:    PHYSICAL EXAMINATION: General:  Well nourished, NAD Neuro:  AAO X3, speech normal, no focal deficits HEENT: PERRLA, Oral mucosa moist and pink, BiPAP mask Cardiovascular:  NSR with short runs of V tach in the low 100s, S1/S2, no murmur, regurg or gallop  Lungs: Bilateral airflow, crackles in bilateral lower lung fields Abdomen:  Non-distended, normal bowel sounds Musculoskeletal:  No joint deformities, +rom Extremities:+2 edema, +2 pulses Skin: warm and dry; venous stasis discoloration in bilateral lower extremities  LABS:  BMET  Recent Labs Lab 01/05/16 0920 01/05/16 1650   NA 139 136  K 2.9* 4.7  CL 111 109  CO2 20* 20*  BUN 21* 24*  CREATININE 1.47* 1.87*  GLUCOSE 181* 155*    Electrolytes  Recent Labs Lab 01/05/16 0920 01/05/16 1650  CALCIUM 8.2* 8.2*  MG 1.9  --     CBC  Recent Labs Lab 01/05/16 0920  WBC 7.1  HGB 11.4*  HCT 35.4*  PLT 149*    Coag's  Recent Labs Lab 01/05/16 0920  APTT 28  INR 1.21    Sepsis Markers No results for input(s): LATICACIDVEN, PROCALCITON, O2SATVEN in the last 168 hours.  ABG  Recent Labs Lab 01/05/16 0950  PHART 7.34*  PCO2ART 38  PO2ART 61*    Liver Enzymes  Recent Labs Lab 01/05/16 0920  AST 24  ALT 22  ALKPHOS 71  BILITOT 0.8  ALBUMIN 3.0*    Cardiac Enzymes  Recent Labs Lab 01/05/16 0920 01/05/16 1650  TROPONINI 0.22* 2.16*    Glucose  Recent Labs Lab 01/05/16 1808  GLUCAP 144*    Imaging Dg Chest 1 View  01/05/2016  CLINICAL DATA:  Weakness and dizziness. [name] out of his chair this morning. EXAM: CHEST 1 VIEW COMPARISON:  None. FINDINGS: Enlarged cardiac silhouette. Prominent interstitial markings. Normal vasculature. No pleural fluid. Diffuse osteopenia. No fracture or pneumothorax seen. IMPRESSION: Cardiomegaly and chronic interstitial lung disease. Electronically Signed   By: Beckie Salts M.D.   On: 01/05/2016 10:12   Ct Head Wo Contrast  01/05/2016  CLINICAL DATA:  Altered mental status. Weakness and dizziness. Tachycardia. EXAM: CT HEAD WITHOUT CONTRAST TECHNIQUE: Contiguous axial images were obtained from the base of the skull through the vertex without intravenous contrast. COMPARISON:  Report from 10/03/2001 head CT (images not available). FINDINGS: No evidence of parenchymal hemorrhage or extra-axial fluid collection. No mass lesion, mass effect, or midline shift. No CT evidence of acute infarction. Intracranial atherosclerosis. Nonspecific mild subcortical and periventricular white matter hypodensity, most in keeping with chronic small vessel ischemic  change. Small bilateral thalamic lacunar infarcts. Cerebral volume is age appropriate. No ventriculomegaly. The visualized paranasal sinuses are essentially clear. The mastoid air cells are unopacified. No evidence of calvarial fracture. IMPRESSION: 1.  No evidence of acute intracranial abnormality. 2. Intracranial atherosclerosis, mild chronic small vessel ischemia and small bilateral thalamic lacunes. Electronically Signed   By: Delbert Phenix M.D.   On: 01/05/2016 11:07   STUDIES:  Cardiac acth 04/17 Echo 04/17  CULTURES: MRSA screen negative  ANTIBIOTICS: None  SIGNIFICANT EVENTS: 04/16>ED with dyspnea, near-syncope>SVT>Cardioverted  LINES/TUBES: PIVs  DISCUSSION: 82 YO AA male with acute hypoxic respiratory failure 2/2 volume overload, SVT s/p cardioversion now in NSR, chest pain, near syncope and elevated troponin  ASSESSMENT / PLAN:  PULMONARY A: Acute respiratory failure Pulmonary edema P:   -Supplemental O2 via HFNC as tolerated and titrate o regular [place] when stable -IV duiresis  CARDIOVASCULAR A:  SVT s/p cardioversion Hypertension Elevated troponin Near-syncope P:  -Cardiology following -Amio and heparin gttes per cardiology -Due to cardiac cath in am -Cycle cardiac enzymes -EKG in am -2-D echo -prn hydralazine for SBP>170 RENAL A:   AKI Hypomagnesemia Hypokalemia P:   -Monitor creatinine -Monitor and replace electrolytes  HEMATOLOGIC A:   Anemia Thrombocytopenia P:  -Monitor CBC  ENDOCRINE A:   Mild hyperglycemia without a diagnosis of DM P:   -Monitor blood glucose with BMP  NEUROLOGIC A:   Acute encephalopathy-resolving P:   RASS goal: n/a -Monitor mental status   Disposition and family update:   Best Practice: Code Status:  Full. Diet: NPO except for sips and meds GI prophylaxis: not indicated VTE prophylaxis:  SCD's / already on full strength heparin.   Total PCCM time spent =55 minutes  Magdalene S. [facility]  ANP-BC Pulmonary and Critical Care Medicine [facility] Pager [phone] 01/05/2016, 7:56 PM    Wallis BambergKurian Santiago Gladavid Shamonica Schadt, M.D.  [facility]  Medical Director [facility] Medical Director [facility]

## 2016-01-05 NOTE — ED Notes (Signed)
Troponin 2.16. MD notified.

## 2016-01-06 ENCOUNTER — Inpatient Hospital Stay: Payer: Medicare Other

## 2016-01-06 ENCOUNTER — Inpatient Hospital Stay
Admit: 2016-01-06 | Discharge: 2016-01-06 | Disposition: A | Discharge status: Home / Self Care | Payer: Medicare Other | Attending: Cardiovascular Disease | Admitting: Cardiovascular Disease

## 2016-01-06 DIAGNOSIS — I5021 Acute systolic (congestive) heart failure: Secondary | ICD-10-CM | POA: Diagnosis present

## 2016-01-06 DIAGNOSIS — J9601 Acute respiratory failure with hypoxia: Secondary | ICD-10-CM

## 2016-01-06 DIAGNOSIS — I472 Ventricular tachycardia: Principal | ICD-10-CM

## 2016-01-06 DIAGNOSIS — J9600 Acute respiratory failure, unspecified whether with hypoxia or hypercapnia: Secondary | ICD-10-CM | POA: Diagnosis present

## 2016-01-06 DIAGNOSIS — I214 Non-ST elevation (NSTEMI) myocardial infarction: Secondary | ICD-10-CM | POA: Diagnosis present

## 2016-01-06 LAB — COMPREHENSIVE METABOLIC PANEL
ALK PHOS: 83 U/L (ref 38–126)
ALT: 38 U/L (ref 17–63)
ANION GAP: 7 (ref 5–15)
AST: 35 U/L (ref 15–41)
Albumin: 3.1 g/dL — ABNORMAL LOW (ref 3.5–5.0)
BILIRUBIN TOTAL: 0.5 mg/dL (ref 0.3–1.2)
BUN: 32 mg/dL — AB (ref 6–20)
CALCIUM: 8.8 mg/dL — AB (ref 8.9–10.3)
CO2: 22 mmol/L (ref 22–32)
Chloride: 108 mmol/L (ref 101–111)
Creatinine, Ser: 2.05 mg/dL — ABNORMAL HIGH (ref 0.61–1.24)
GFR calc Af Amer: 33 mL/min — ABNORMAL LOW (ref >60)
GFR, EST NON AFRICAN AMERICAN: 28 mL/min — AB (ref >60)
Glucose, Bld: 138 mg/dL — ABNORMAL HIGH (ref 65–99)
POTASSIUM: 3.8 mmol/L (ref 3.5–5.1)
Sodium: 137 mmol/L (ref 135–145)
TOTAL PROTEIN: 6.9 g/dL (ref 6.5–8.1)

## 2016-01-06 LAB — PROTIME-INR
INR: 1.1
PROTHROMBIN TIME: 14.4 s (ref 11.4–15.0)

## 2016-01-06 LAB — CBC WITH DIFFERENTIAL/PLATELET
BASOS PCT: 0 %
Basophils Absolute: 0 10*3/uL (ref 0–0.1)
EOS ABS: 0 10*3/uL (ref 0–0.7)
EOS PCT: 0 %
HCT: 36.5 % — ABNORMAL LOW (ref 40.0–52.0)
HEMOGLOBIN: 12 g/dL — AB (ref 13.0–18.0)
LYMPHS ABS: 0.6 10*3/uL — AB (ref 1.0–3.6)
Lymphocytes Relative: 5 %
MCH: 28 pg (ref 26.0–34.0)
MCHC: 32.9 g/dL (ref 32.0–36.0)
MCV: 85.3 fL (ref 80.0–100.0)
MONOS PCT: 12 %
Monocytes Absolute: 1.4 10*3/uL — ABNORMAL HIGH (ref 0.2–1.0)
NEUTROS PCT: 83 %
Neutro Abs: 9.7 10*3/uL — ABNORMAL HIGH (ref 1.4–6.5)
PLATELETS: 154 10*3/uL (ref 150–440)
RBC: 4.28 MIL/uL — ABNORMAL LOW (ref 4.40–5.90)
RDW: 16.1 % — AB (ref 11.5–14.5)
WBC: 11.7 10*3/uL — ABNORMAL HIGH (ref 3.8–10.6)

## 2016-01-06 LAB — BASIC METABOLIC PANEL
Anion gap: 9 (ref 5–15)
BUN: 30 mg/dL — AB (ref 6–20)
CO2: 20 mmol/L — ABNORMAL LOW (ref 22–32)
CREATININE: 1.75 mg/dL — AB (ref 0.61–1.24)
Calcium: 8.8 mg/dL — ABNORMAL LOW (ref 8.9–10.3)
Chloride: 108 mmol/L (ref 101–111)
GFR calc Af Amer: 39 mL/min — ABNORMAL LOW (ref >60)
GFR, EST NON AFRICAN AMERICAN: 34 mL/min — AB (ref >60)
Glucose, Bld: 122 mg/dL — ABNORMAL HIGH (ref 65–99)
Potassium: 3.5 mmol/L (ref 3.5–5.1)
SODIUM: 137 mmol/L (ref 135–145)

## 2016-01-06 LAB — HEPARIN LEVEL (UNFRACTIONATED)
HEPARIN UNFRACTIONATED: 0.45 [IU]/mL (ref 0.30–0.70)
HEPARIN UNFRACTIONATED: 0.24 [IU]/mL — AB (ref 0.30–0.70)

## 2016-01-06 LAB — CBC
HCT: 37.6 % — ABNORMAL LOW (ref 40.0–52.0)
Hemoglobin: 12.2 g/dL — ABNORMAL LOW (ref 13.0–18.0)
MCH: 28.1 pg (ref 26.0–34.0)
MCHC: 32.5 g/dL (ref 32.0–36.0)
MCV: 86.5 fL (ref 80.0–100.0)
PLATELETS: 153 10*3/uL (ref 150–440)
RBC: 4.34 MIL/uL — AB (ref 4.40–5.90)
RDW: 16.3 % — ABNORMAL HIGH (ref 11.5–14.5)
WBC: 11.8 10*3/uL — ABNORMAL HIGH (ref 3.8–10.6)

## 2016-01-06 LAB — ECHOCARDIOGRAM COMPLETE
Height: 71 in
Weight: 3354.52 oz

## 2016-01-06 LAB — APTT: APTT: 30 s (ref 24–36)

## 2016-01-06 LAB — PHOSPHORUS: PHOSPHORUS: 3.2 mg/dL (ref 2.5–4.6)

## 2016-01-06 LAB — MAGNESIUM: Magnesium: 2.2 mg/dL (ref 1.7–2.4)

## 2016-01-06 MED ORDER — MORPHINE SULFATE (PF) 2 MG/ML IV SOLN
2.0000 mg | INTRAVENOUS | Status: DC | PRN
  Administered 2016-01-06 – 2016-01-07 (×3): 2 mg via INTRAVENOUS
  Filled 2016-01-06 (×4): qty 1

## 2016-01-06 MED ORDER — FENTANYL CITRATE (PF) 100 MCG/2ML IJ SOLN
INTRAMUSCULAR | Status: AC
  Filled 2016-01-06: qty 4

## 2016-01-06 MED ORDER — ASPIRIN 81 MG PO CHEW: 81.0000 mg | CHEWABLE_TABLET | ORAL | Status: DC

## 2016-01-06 MED ORDER — IPRATROPIUM-ALBUTEROL 0.5-2.5 (3) MG/3ML IN SOLN
3.0000 mL | RESPIRATORY_TRACT | Status: DC
  Administered 2016-01-06 – 2016-01-07 (×5): 3 mL via RESPIRATORY_TRACT
  Filled 2016-01-06 (×5): qty 3

## 2016-01-06 MED ORDER — DOXAZOSIN MESYLATE 2 MG PO TABS
2.0000 mg | ORAL_TABLET | Freq: Every day | ORAL | Status: DC
  Administered 2016-01-06 – 2016-01-15 (×10): 2 mg via ORAL
  Filled 2016-01-06 (×11): qty 1

## 2016-01-06 MED ORDER — VECURONIUM BROMIDE 10 MG IV SOLR
INTRAVENOUS | Status: AC
  Filled 2016-01-06: qty 10

## 2016-01-06 MED ORDER — STERILE WATER FOR INJECTION IJ SOLN
INTRAMUSCULAR | Status: AC
  Filled 2016-01-06: qty 10

## 2016-01-06 MED ORDER — FUROSEMIDE 10 MG/ML IJ SOLN
INTRAMUSCULAR | Status: AC
  Filled 2016-01-06: qty 4

## 2016-01-06 MED ORDER — ATORVASTATIN CALCIUM 20 MG PO TABS
40.0000 mg | ORAL_TABLET | Freq: Every day | ORAL | Status: DC
  Administered 2016-01-06 – 2016-01-14 (×9): 40 mg via ORAL
  Filled 2016-01-06 (×2): qty 4
  Filled 2016-01-06 (×3): qty 2
  Filled 2016-01-06: qty 4
  Filled 2016-01-06: qty 2
  Filled 2016-01-06 (×2): qty 4

## 2016-01-06 MED ORDER — LORATADINE 10 MG PO TABS
10.0000 mg | ORAL_TABLET | Freq: Every day | ORAL | Status: DC
  Administered 2016-01-06 – 2016-01-15 (×10): 10 mg via ORAL
  Filled 2016-01-06 (×10): qty 1

## 2016-01-06 MED ORDER — SODIUM CHLORIDE 0.9 % WEIGHT BASED INFUSION: 1.0000 mL/kg/h | INTRAVENOUS | Status: DC

## 2016-01-06 MED ORDER — SODIUM CHLORIDE 0.9% FLUSH: 3.0000 mL | INTRAVENOUS | Status: DC | PRN

## 2016-01-06 MED ORDER — MIDAZOLAM HCL 2 MG/2ML IJ SOLN
INTRAMUSCULAR | Status: AC
  Filled 2016-01-06: qty 4

## 2016-01-06 MED ORDER — SODIUM CHLORIDE 0.9 % IV SOLN: 250.0000 mL | INTRAVENOUS | Status: DC | PRN

## 2016-01-06 MED ORDER — SODIUM CHLORIDE 0.9% FLUSH: 3.0000 mL | Freq: Two times a day (BID) | INTRAVENOUS | Status: DC

## 2016-01-06 MED ORDER — HEPARIN (PORCINE) IN NACL 100-0.45 UNIT/ML-% IJ SOLN
1550.0000 [IU]/h | INTRAMUSCULAR | Status: DC
  Administered 2016-01-07 – 2016-01-08 (×2): 1350 [IU]/h via INTRAVENOUS
  Filled 2016-01-06: qty 250

## 2016-01-06 MED ORDER — HEPARIN BOLUS VIA INFUSION
1200.0000 [IU] | Freq: Once | INTRAVENOUS | Status: AC
  Administered 2016-01-06: 1200 [IU] via INTRAVENOUS
  Filled 2016-01-06: qty 1200

## 2016-01-06 MED ORDER — FUROSEMIDE 10 MG/ML IJ SOLN
40.0000 mg | Freq: Once | INTRAMUSCULAR | Status: AC
  Administered 2016-01-06: 40 mg via INTRAVENOUS
  Filled 2016-01-06: qty 4

## 2016-01-06 MED ORDER — FUROSEMIDE 10 MG/ML IJ SOLN
20.0000 mg | Freq: Two times a day (BID) | INTRAMUSCULAR | Status: DC
  Administered 2016-01-07: 20 mg via INTRAVENOUS
  Filled 2016-01-06: qty 2

## 2016-01-06 MED ORDER — MAGNESIUM SULFATE 2 GM/50ML IV SOLN
2.0000 g | Freq: Once | INTRAVENOUS | Status: AC
  Administered 2016-01-06: 2 g via INTRAVENOUS
  Filled 2016-01-06: qty 50

## 2016-01-06 MED ORDER — SODIUM CHLORIDE 0.9 % WEIGHT BASED INFUSION: 3.0000 mL/kg/h | INTRAVENOUS | Status: DC

## 2016-01-06 MED ORDER — CETYLPYRIDINIUM CHLORIDE 0.05 % MT LIQD
7.0000 mL | Freq: Two times a day (BID) | OROMUCOSAL | Status: DC
  Administered 2016-01-06 – 2016-01-15 (×16): 7 mL via OROMUCOSAL

## 2016-01-06 NOTE — Progress Notes (Signed)
SUBJECTIVE: Patient was brought to the catheter lab for cardiac catheterization but was in respiratory distress and the nurses called me to evaluate the patient and was placed on BiPAP and given Lasix 40 mg IV along with placing a Foley catheter.   Filed Vitals:   01/06/16 0630 01/06/16 0700 01/06/16 0734 01/06/16 0800  BP:  178/88 173/94 168/73  Pulse:  102 108 106  Temp:      TempSrc:      Resp:  19 18 30   Height:      Weight: 209 lb 10.5 oz (95.1 kg)     SpO2:  84% 81% 93%    Intake/Output Summary (Last 24 hours) at 01/06/16 0825 Last data filed at 01/06/16 0700  Gross per 24 hour  Intake 5281.2 ml  Output    800 ml  Net 4481.2 ml    LABS: Basic Metabolic Panel:  Recent Labs  16/06/9603/16/17 0920 01/05/16 1650 01/05/16 1957 01/06/16 0443  NA 139 136  --  137  K 2.9* 4.7  --  3.5  CL 111 109  --  108  CO2 20* 20*  --  20*  GLUCOSE 181* 155*  --  122*  BUN 21* 24*  --  30*  CREATININE 1.47* 1.87*  --  1.75*  CALCIUM 8.2* 8.2*  --  8.8*  MG 1.9  --  1.9  --    Liver Function Tests:  Recent Labs  01/05/16 0920  AST 24  ALT 22  ALKPHOS 71  BILITOT 0.8  PROT 6.6  ALBUMIN 3.0*   No results for input(s): LIPASE, AMYLASE in the last 72 hours. CBC:  Recent Labs  01/05/16 0920 01/06/16 0443  WBC 7.1 11.8*  HGB 11.4* 12.2*  HCT 35.4* 37.6*  MCV 87.5 86.5  PLT 149* 153   Cardiac Enzymes:  Recent Labs  01/05/16 0920 01/05/16 1650 01/05/16 2327  TROPONINI 0.22* 2.16* 3.23*   BNP: Invalid input(s): POCBNP D-Dimer: No results for input(s): DDIMER in the last 72 hours. Hemoglobin A1C: No results for input(s): HGBA1C in the last 72 hours. Fasting Lipid Panel: No results for input(s): CHOL, HDL, LDLCALC, TRIG, CHOLHDL, LDLDIRECT in the last 72 hours. Thyroid Function Tests:  Recent Labs  01/05/16 0920  TSH 1.713   Anemia Panel: No results for input(s): VITAMINB12, FOLATE, FERRITIN, TIBC, IRON, RETICCTPCT in the last 72 hours.   PHYSICAL  EXAM General: Well developed, well nourished, in no acute distress HEENT:  Normocephalic and atramatic Neck:  No JVD.  Lungs: Clear bilaterally to auscultation and percussion. Heart: HRRR . Normal S1 and S2 without gallops or murmurs.  Abdomen: Bowel sounds are positive, abdomen soft and non-tender  Msk:  Back normal, normal gait. Normal strength and tone for age. Extremities: No clubbing, cyanosis or edema.   Neuro: Alert and oriented X 3. Psych:  Good affect, responds appropriately  TELEMETRY:Sinus rhythm  ASSESSMENT AND PLAN: Pulmonary edema with elevated troponin leading to non-STEMI and status post sustained ventricular tachycardia. Patient is currently at respiratory distress and we will have to be reassessed by critical care if he needs intubation. Thus will hold off doing cardiac catheterization at this time. We will get an echocardiogram to assess ejection fraction.  Active Problems:   Ventricular tachycardia (HCC)    Adrian BlackwaterKHAN,Randle Shatzer A, MD, [facility] Center[facility]ACC 01/06/2016 8:25 AM

## 2016-01-06 NOTE — Progress Notes (Addendum)
[facility] [place] [facility]   PATIENT NAME: Jermaine Fields    MR#:  [id number]  DATE OF BIRTH:  03/04/1930  SUBJECTIVE:  CHIEF COMPLAINT:   Chief Complaint  Patient presents with  . Tachycardia   Cough and SOB, on BIPAP. Cardiac cath was canceled due to respiratory distress. REVIEW OF SYSTEMS:  CONSTITUTIONAL: No fever, has weakness.  EYES: No blurred or double vision.  EARS, NOSE, AND THROAT: No tinnitus or ear pain.  RESPIRATORY: has cough, shortness of breath, no wheezing or hemoptysis.  CARDIOVASCULAR: No chest pain, orthopnea, has leg edema.  GASTROINTESTINAL: No nausea, vomiting, diarrhea or abdominal pain.  GENITOURINARY: No dysuria, hematuria.  ENDOCRINE: No polyuria, nocturia,  HEMATOLOGY: No anemia, easy bruising or bleeding SKIN: No rash or lesion. MUSCULOSKELETAL: No joint pain or arthritis.   NEUROLOGIC: No tingling, numbness, weakness.  PSYCHIATRY: No anxiety or depression.   DRUG ALLERGIES:   Allergies  Allergen Reactions  . Vitamin D2 [Ergocalciferol] Other (See Comments)    This medication made the patient too hot.    VITALS:  Blood pressure 130/86, pulse 98, temperature 98.3 F (36.8 C), temperature source Oral, resp. rate 22, height 5\' 11"  (1.803 m), weight 95.1 kg (209 lb 10.5 oz), SpO2 98 %.  PHYSICAL EXAMINATION:  GENERAL:  82 y.o.-year-old patient lying in the bed with no acute distress. obese. EYES: Pupils equal, round, reactive to light and accommodation. No scleral icterus. Extraocular muscles intact.  HEENT: Head atraumatic, normocephalic. Oropharynx and nasopharynx clear.  NECK:  Supple, no jugular venous distention. No thyroid enlargement, no tenderness.  LUNGS: Normal breath sounds bilaterally, no wheezing, bilateral basilar rales, no rhonchi or crepitation. No use of accessory muscles of respiration.  CARDIOVASCULAR: S1, S2 normal. No murmurs, rubs, or gallops.  ABDOMEN: Soft, nontender, nondistended. Bowel  sounds present. No organomegaly or mass.  EXTREMITIES: bilateral leg edema 1-2 +, no cyanosis, or clubbing.  NEUROLOGIC: Cranial nerves II through XII are intact. Muscle strength 4/5 in all extremities. Sensation intact. Gait not checked.  PSYCHIATRIC: The patient is alert and oriented x 3.  SKIN: No obvious rash, lesion, or ulcer.    LABORATORY PANEL:   CBC  Recent Labs Lab 01/06/16 1036  WBC 11.7*  HGB 12.0*  HCT 36.5*  PLT 154   ------------------------------------------------------------------------------------------------------------------  Chemistries   Recent Labs Lab 01/06/16 1036  NA 137  K 3.8  CL 108  CO2 22  GLUCOSE 138*  BUN 32*  CREATININE 2.05*  CALCIUM 8.8*  MG 2.2  AST 35  ALT 38  ALKPHOS 83  BILITOT 0.5   ------------------------------------------------------------------------------------------------------------------  Cardiac Enzymes  Recent Labs Lab 01/05/16 2327  TROPONINI 3.23*   ------------------------------------------------------------------------------------------------------------------  RADIOLOGY:  Dg Chest 1 View  01/05/2016  CLINICAL DATA:  Weakness and dizziness. Larey SeatFell out of his chair this morning. EXAM: CHEST 1 VIEW COMPARISON:  None. FINDINGS: Enlarged cardiac silhouette. Prominent interstitial markings. Normal vasculature. No pleural fluid. Diffuse osteopenia. No fracture or pneumothorax seen. IMPRESSION: Cardiomegaly and chronic interstitial lung disease. Electronically Signed   By: Beckie SaltsSteven  Reid M.D.   On: 01/05/2016 10:12   Ct Head Wo Contrast  01/05/2016  CLINICAL DATA:  Altered mental status. Weakness and dizziness. Tachycardia. EXAM: CT HEAD WITHOUT CONTRAST TECHNIQUE: Contiguous axial images were obtained from the base of the skull through the vertex without intravenous contrast. COMPARISON:  Report from 10/03/2001 head CT (images not available). FINDINGS: No evidence of parenchymal hemorrhage or extra-axial fluid  collection. No mass lesion, mass  effect, or midline shift. No CT evidence of acute infarction. Intracranial atherosclerosis. Nonspecific mild subcortical and periventricular white matter hypodensity, most in keeping with chronic small vessel ischemic change. Small bilateral thalamic lacunar infarcts. Cerebral volume is age appropriate. No ventriculomegaly. The visualized paranasal sinuses are essentially clear. The mastoid air cells are unopacified. No evidence of calvarial fracture. IMPRESSION: 1.  No evidence of acute intracranial abnormality. 2. Intracranial atherosclerosis, mild chronic small vessel ischemia and small bilateral thalamic lacunes. Electronically Signed   By: Delbert Phenix M.D.   On: 01/05/2016 11:07   Dg Chest Port 1 View  01/06/2016  CLINICAL DATA:  Shortness of breath and tachycardia. Respiratory failure. EXAM: PORTABLE CHEST 1 VIEW COMPARISON:  Single view of the chest 01/05/2016 FINDINGS: Defibrillator pad remains in place. There is cardiomegaly. Extensive bilateral airspace disease appears worsened. There may be bilateral pleural effusions. IMPRESSION: Worsening bilateral airspace disease most consistent with increased pulmonary edema. Likely small to moderate bilateral pleural effusions noted. Electronically Signed   By: Drusilla Kanner M.D.   On: 01/06/2016 09:07    EKG:   Orders placed or performed during the hospital encounter of 01/05/16  . ED EKG  . ED EKG  . EKG 12-Lead  . EKG 12-Lead    ASSESSMENT AND PLAN:   1. Ventricular tachycardia and non-STEMI .   Status post electrical cardioversion of sustained ventricular tachycardia. Dr. Welton Flakes was unable to get cardiac cath due to respiratory failure, continue Heparin drip and amiodarone drip. Continue ASA, add lipitor,  Check lipid.  2. Acute systolic CHF, LV EF: 20% - 25%. Continue lasix, fluid restriction, Echo: LV EF: 20% - 25%. Lasix 40 mg iv one dose now per Dr. Welton Flakes.  3.  Acute respiratory failure with  hypoxia. Continue BIPAP, try to wean down to HF. NEB. Continue lasix.  4. AKI. Due to prerenal or ATN. F/u BMP.  Renal US. Hold lisinopril.  5. Hypokalemia. Improved with IV potassium.  6. BPH. continue doxazosin  6. Hypertension. Continue lasix. Hold lisinopril, norvasc and clonidine.  I discussed with Dr. Belia Heman and Dr. Welton Flakes.  High risk for cardiopulmonary arrest. All the records are reviewed and case discussed with Care Management/Social Workerr. Management plans discussed with the patient, his wife and they are in agreement.  CODE STATUS: full code.  TOTAL CRITICAL TIME TAKING CARE OF THIS PATIENT: 45 minutes.  Greater than 50% time was spent on coordination of care and face-to-face counseling.  POSSIBLE D/C IN >3 DAYS, DEPENDING ON CLINICAL CONDITION.   Shaune Pollack M.D on 01/06/2016 at 12:40 PM  Between 7am to 6pm - Pager - [phone]  After 6pm go to [email or web address] - password [email or web address] [facility]  [place] [facility] [facility]  Office  [phone]  CC: Primary care physician; Oswaldo Conroy, MD

## 2016-01-06 NOTE — Consult Note (Signed)
PULMONARY / CRITICAL CARE MEDICINE   Name: Jermaine Fields MRN: [id number] DOB: 04/06/1930    ADMISSION DATE:  01/05/2016   CONSULTATION DATE:  01/05/2016  REFERRING MD:  ED  CHIEF COMPLAINT:  Ventricular tachycardia, presyncope and dyspnea  HISTORY OF PRESENT ILLNESS: patient with acute resp failure, on biPAP fio2 at 100% High risk for intubation  REVIEW OF SYSTEMS:   Constitutional: Reports generalized weakness but denies fever and chills HENT: Negative for congestion and rhinorrhea.  Eyes: Negative for redness and visual disturbance.  Respiratory: Positive for shortness of breath but negative for wheezing.  Cardiovascular: Positive for chest pain, dizziness and palpitations.  Gastrointestinal: Negative  for nausea , vomiting and abdominal pain and loose stools Genitourinary: Negative for dysuria and urgency.  Musculoskeletal: Negative for myalgias and arthralgias but positive for gait instability and falls.  Skin: Negative for pallor and wound.  Neurological: Positive for dizziness but negative for headaches    VITAL SIGNS: BP 168/73 mmHg  Pulse 106  Temp(Src) 98.3 F (36.8 C) (Oral)  Resp 30  Ht 5\' 11"  (1.803 m)  Wt 209 lb 10.5 oz (95.1 kg)  BMI 29.25 kg/m2  SpO2 93%     VENTILATOR SETTINGS: Vent Mode:  [-]  FiO2 (%):  [100 %] 100 %  INTAKE / OUTPUT: I/O last 3 completed shifts: In: 5281.2 [I.V.:5281.2] Out: 800 [Urine:800]  PHYSICAL EXAMINATION: General:  Well nourished,resp distress Neuro:  AAO X3, speech normal, no focal deficits HEENT: PERRLA, Oral mucosa moist and pink, BiPAP mask Cardiovascular:  NSR with short runs of V tach in the low 100s, S1/S2, no murmur, regurg or gallop Lungs: Bilateral airflow, crackles in bilateral lower lung fields Abdomen:  Non-distended, normal bowel sounds Musculoskeletal:  No joint deformities, +rom Extremities:+2 edema, +2 pulses Skin: warm and dry; venous stasis discoloration in bilateral lower  extremities  LABS:  BMET  Recent Labs Lab 01/05/16 0920 01/05/16 1650 01/06/16 0443  NA 139 136 137  K 2.9* 4.7 3.5  CL 111 109 108  CO2 20* 20* 20*  BUN 21* 24* 30*  CREATININE 1.47* 1.87* 1.75*  GLUCOSE 181* 155* 122*    Electrolytes  Recent Labs Lab 01/05/16 0920 01/05/16 1650 01/05/16 1957 01/06/16 0443  CALCIUM 8.2* 8.2*  --  8.8*  MG 1.9  --  1.9  --     CBC  Recent Labs Lab 01/05/16 0920 01/06/16 0443  WBC 7.1 11.8*  HGB 11.4* 12.2*  HCT 35.4* 37.6*  PLT 149* 153    Coag's  Recent Labs Lab 01/05/16 0920  APTT 28  INR 1.21    Sepsis Markers No results for input(s): LATICACIDVEN, PROCALCITON, O2SATVEN in the last 168 hours.  ABG  Recent Labs Lab 01/05/16 0950  PHART 7.34*  PCO2ART 38  PO2ART 61*    Liver Enzymes  Recent Labs Lab 01/05/16 0920  AST 24  ALT 22  ALKPHOS 71  BILITOT 0.8  ALBUMIN 3.0*    Cardiac Enzymes  Recent Labs Lab 01/05/16 0920 01/05/16 1650 01/05/16 2327  TROPONINI 0.22* 2.16* 3.23*    Glucose  Recent Labs Lab 01/05/16 1808  GLUCAP 144*    Imaging Dg Chest 1 View  01/05/2016  CLINICAL DATA:  Weakness and dizziness. Larey SeatFell out of his chair this morning. EXAM: CHEST 1 VIEW COMPARISON:  None. FINDINGS: Enlarged cardiac silhouette. Prominent interstitial markings. Normal vasculature. No pleural fluid. Diffuse osteopenia. No fracture or pneumothorax seen. IMPRESSION: Cardiomegaly and chronic interstitial lung disease. Electronically Signed   By: Viviann SpareSteven  Azucena Kuba M.D.   On: 01/05/2016 10:12   Ct Head Wo Contrast  01/05/2016  CLINICAL DATA:  Altered mental status. Weakness and dizziness. Tachycardia. EXAM: CT HEAD WITHOUT CONTRAST TECHNIQUE: Contiguous axial images were obtained from the base of the skull through the vertex without intravenous contrast. COMPARISON:  Report from 10/03/2001 head CT (images not available). FINDINGS: No evidence of parenchymal hemorrhage or extra-axial fluid collection. No  mass lesion, mass effect, or midline shift. No CT evidence of acute infarction. Intracranial atherosclerosis. Nonspecific mild subcortical and periventricular white matter hypodensity, most in keeping with chronic small vessel ischemic change. Small bilateral thalamic lacunar infarcts. Cerebral volume is age appropriate. No ventriculomegaly. The visualized paranasal sinuses are essentially clear. The mastoid air cells are unopacified. No evidence of calvarial fracture. IMPRESSION: 1.  No evidence of acute intracranial abnormality. 2. Intracranial atherosclerosis, mild chronic small vessel ischemia and small bilateral thalamic lacunes. Electronically Signed   By: Delbert Phenix M.D.   On: 01/05/2016 11:07   STUDIES:  Cardiac acth 04/17 Echo 04/17  CULTURES: MRSA screen negative  ANTIBIOTICS: None  SIGNIFICANT EVENTS: 04/16>ED with dyspnea, near-syncope>SVT>Cardioverted  LINES/TUBES: PIVs  DISCUSSION: 82 YO AA male with acute hypoxic respiratory failure 2/2 volume overload, SVT s/p cardioversion now in NSR, chest pain, near syncope and elevated troponin likely acute CHF exacerbation  ASSESSMENT / PLAN:  PULMONARY A: Acute respiratory failure Pulmonary edema P:   On biPAP, check CXR and ABG -IV duiresis -high risk for intubation/cardiac arrest -  CARDIOVASCULAR A:  SVT s/p cardioversion Hypertension Elevated troponin Near-syncope P:  -Cardiology following -Amio and heparin gttes per cardiology -can not get cardiac cath due to resp failure -Cycle cardiac enzymes -2-D echo pending -prn hydralazine for SBP>170  RENAL A:   AKI Hypomagnesemia Hypokalemia P:   -Monitor creatinine -Monitor and replace electrolytes  HEMATOLOGIC A:   Anemia Thrombocytopenia P:  -Monitor CBC  ENDOCRINE A:   Mild hyperglycemia without a diagnosis of DM P:   -Monitor blood glucose with BMP  NEUROLOGIC A:   Acute encephalopathy-resolving P:   RASS goal: n/a -Monitor mental  status     Best Practice: Code Status:  Full. Diet: NPO except for sips and meds GI prophylaxis: not indicated VTE prophylaxis:  SCD's / already on full strength heparin.   I have personally obtained a history, examined the patient, evaluated Pertinent laboratory and RadioGraphic/imaging results, and  formulated the assessment and plan   The Patient requires high complexity decision making for assessment and support, frequent evaluation and titration of therapies, application of advanced monitoring technologies and extensive interpretation of multiple databases. Critical Care Time devoted to patient care services described in this note is 40 minutes.   Overall, patient is critically ill, prognosis is guarded.  Patient with Multiorgan failure and at high risk for cardiac arrest and death.    Lucie Leather, M.D.  [facility]  Medical Director [facility] Medical Director [facility] Cardio-Pulmonary Department

## 2016-01-06 NOTE — Progress Notes (Signed)
ANTICOAGULATION CONSULT NOTE - Follow Up Consult  Pharmacy Consult for Heparin  Indication: chest pain/ACS  Allergies  Allergen Reactions  . Vitamin D2 [Ergocalciferol] Other (See Comments)    This medication made the patient too hot.    Patient Measurements: Height: 5\' 11"  (180.3 cm) Weight: 209 lb 10.5 oz (95.1 kg) IBW/kg (Calculated) : 75.3 Heparin Dosing Weight: 81.6 kg   Vital Signs: Temp: 98.5 F (36.9 C) (04/17 1614) Temp Source: Oral (04/17 1614) BP: 149/75 mmHg (04/17 1900) Pulse Rate: 98 (04/17 1900)  Labs:  Recent Labs  01/05/16 0920 01/05/16 1650 01/05/16 1957 01/05/16 2327 01/06/16 0443 01/06/16 1036 01/06/16 2008  HGB 11.4*  --   --   --  12.2* 12.0*  --   HCT 35.4*  --   --   --  37.6* 36.5*  --   PLT 149*  --   --   --  153 154  --   APTT 28  --   --   --   --  30  --   LABPROT 15.5*  --   --   --   --  14.4  --   INR 1.21  --   --   --   --  1.10  --   HEPARINUNFRC  --   --  0.60  --  0.45  --  0.24*  CREATININE 1.47* 1.87*  --   --  1.75* 2.05*  --   TROPONINI 0.22* 2.16*  --  3.23*  --   --   --     Estimated Creatinine Clearance: 31.6 mL/min (by C-G formula based on Cr of 2.05).   Medical History: Past Medical History  Diagnosis Date  . Hypertension   . BPH (benign prostatic hyperplasia)   . Environmental allergies     Medications:  Prescriptions prior to admission  Medication Sig Dispense Refill Last Dose  . amLODipine (NORVASC) 10 MG tablet Take 10 mg by mouth daily.   01/04/2016 at Unknown time  . aspirin EC 81 MG tablet Take 81 mg by mouth daily.   01/04/2016 at Unknown time  . cetirizine (ZYRTEC) 10 MG tablet Take 10 mg by mouth daily.   01/04/2016 at Unknown time  . cloNIDine (CATAPRES) 0.1 MG tablet Take 0.1 mg by mouth 2 (two) times daily.   01/04/2016 at Unknown time  . doxazosin (CARDURA) 2 MG tablet Take 2 mg by mouth daily.   01/04/2016 at Unknown time  . fluticasone (FLONASE) 50 MCG/ACT nasal spray Place 2 sprays into both  nostrils daily as needed. For sinus congestion.   01/04/2016 at Unknown time  . lisinopril (PRINIVIL,ZESTRIL) 40 MG tablet Take 40 mg by mouth daily.   01/04/2016 at Unknown time    Assessment: Pharmacy consulted to dose heparin in this 82 year old male admitted with elevated cardiac enzymes.   Goal of Therapy:  Heparin level 0.3-0.7 units/ml Monitor platelets by anticoagulation protocol: Yes   Plan:  Heparin drip resumed after after patient returned from cancelled cath. Will resume heparin drip at previous rate and f/u heparin level in 8 hours.   4/17 at 20:08 HL = 0.24.  Ordered a bolus of 1200 units and increased drip rate to 1150 units/hr.   Next HL ordered for 4/18 at 04:00.    Stormy CardKatsoudas,Jerlyn Pain K, RPh Clinical Pharmacist 01/06/2016,9:01 PM

## 2016-01-06 NOTE — Clinical Documentation Improvement (Signed)
Internal Medicine  Can the diagnosis of CHF be further specified?    Acuity - Acute, Chronic, Acute on Chronic   Type - Systolic, Diastolic, Systolic and Diastolic  Other  Clinically Undetermined   Document any associated diagnoses/conditions Supporting Information: Noted ECHO in process,  Pending cardiac cath  BNP 01/05/16  1339.0 01/05/16: Hypoxia - Decreased sats requiring high flow FM O2. Hx of chronic interstitial lung disease                    Lasix 60 mg IV X 1 now.     01/05/16 provider note >Pulmonary: Pulmonary edema, IV duiresis Extremities:+2 edema 01/06/16 Nursing note:lung sounds were more rhonchi and wet sounding and he had an increased lower extremity edema  01/06/16  Last data filed at 01/06/16 0700  Gross per 24 hour  Intake 5281.2 ml  Output  800 ml  Net 4481.2 ml       01/06/16 Critical care: likely acute CHF exacerbation     Please exercise your independent, professional judgment when responding. A specific answer is not anticipated or expected. Please update your documentation within the medical record to reflect your response to this query. Thank you  Thank Barrie DunkerYou,  Daivik Overley C Amiyrah Lamere [facility] [employer] [facility] [place] [phone]

## 2016-01-06 NOTE — Progress Notes (Signed)
Chaplain rounded the unit and provided a compassionate presence ans spiritual support. Jefm PettyChaplain Dannon Nguyenthi [phone]

## 2016-01-06 NOTE — Progress Notes (Signed)
*  PRELIMINARY RESULTS* Echocardiogram 2D Echocardiogram has been performed.  Jermaine HousekeeperJerry R Fields 01/06/2016, 11:09 AM

## 2016-01-06 NOTE — OR Nursing (Signed)
Dr Welton FlakesKhan into see, pat. Given 40 IV Lasix, foly cath placed in bladder with min. Urine return, placed on BIPAP by RT with slow increase in sats to 93% IPAR 15, EOAO 8, FI02 100. Rate 10... Orders to return to ICU for ICU doc to assess

## 2016-01-06 NOTE — OR Nursing (Signed)
Received from CCU 3 sats 75% on 100 % NRB, saying one word at a time RR 28, lungs with crackles through out and course air sounds, sitting up, Cath lab notified of above, RT here and placing on high flow air, currently sats 79% , MD made aware by Cath lab staff

## 2016-01-06 NOTE — Progress Notes (Signed)
Pt was resting comfortably with wife at beside. When I entered the room the pt became very anxious and complaining of increased SOB, and needing to urinate. The pt was diaphoretic and desatting on the HFNC. This pt was scheduled for a heart cath so I stopped the Heparin and per RT placed the pt on a non-rebreather for transport. Pts lung sounds were more rhonchi and wet sounding and he had an increased lower extremity edema. [facility] to [facility] Charity fundraiserN. Pt not stable at this time.

## 2016-01-06 NOTE — Progress Notes (Signed)
ANTICOAGULATION CONSULT NOTE - Initial Consult  Pharmacy Consult for Heparin  Indication: chest pain/ACS  Allergies  Allergen Reactions  . Vitamin D2 [Ergocalciferol] Other (See Comments)    This medication made the patient too hot.    Patient Measurements: Height: 5\' 11"  (180.3 cm) Weight: 209 lb 10.5 oz (95.1 kg) IBW/kg (Calculated) : 75.3 Heparin Dosing Weight: 81.6 kg   Vital Signs: BP: 130/86 mmHg (04/17 1126) Pulse Rate: 98 (04/17 1126)  Labs:  Recent Labs  01/05/16 0920 01/05/16 1650 01/05/16 1957 01/05/16 2327 01/06/16 0443 01/06/16 1036  HGB 11.4*  --   --   --  12.2* 12.0*  HCT 35.4*  --   --   --  37.6* 36.5*  PLT 149*  --   --   --  153 154  APTT 28  --   --   --   --  30  LABPROT 15.5*  --   --   --   --  14.4  INR 1.21  --   --   --   --  1.10  HEPARINUNFRC  --   --  0.60  --  0.45  --   CREATININE 1.47* 1.87*  --   --  1.75* 2.05*  TROPONINI 0.22* 2.16*  --  3.23*  --   --     Estimated Creatinine Clearance: 31.6 mL/min (by C-G formula based on Cr of 2.05).   Medical History: Past Medical History  Diagnosis Date  . Hypertension   . BPH (benign prostatic hyperplasia)   . Environmental allergies     Medications:  Prescriptions prior to admission  Medication Sig Dispense Refill Last Dose  . amLODipine (NORVASC) 10 MG tablet Take 10 mg by mouth daily.   01/04/2016 at Unknown time  . aspirin EC 81 MG tablet Take 81 mg by mouth daily.   01/04/2016 at Unknown time  . cetirizine (ZYRTEC) 10 MG tablet Take 10 mg by mouth daily.   01/04/2016 at Unknown time  . cloNIDine (CATAPRES) 0.1 MG tablet Take 0.1 mg by mouth 2 (two) times daily.   01/04/2016 at Unknown time  . doxazosin (CARDURA) 2 MG tablet Take 2 mg by mouth daily.   01/04/2016 at Unknown time  . fluticasone (FLONASE) 50 MCG/ACT nasal spray Place 2 sprays into both nostrils daily as needed. For sinus congestion.   01/04/2016 at Unknown time  . lisinopril (PRINIVIL,ZESTRIL) 40 MG tablet Take 40 mg  by mouth daily.   01/04/2016 at Unknown time    Assessment: Pharmacy consulted to dose heparin in this 82 year old male admitted with elevated cardiac enzymes.   Goal of Therapy:  Heparin level 0.3-0.7 units/ml Monitor platelets by anticoagulation protocol: Yes   Plan:  Heparin drip resumed after after patient returned from cancelled cath. Will resume heparin drip at previous rate and f/u heparin level in 8 hours.   Luisa Harthristy, Jersi Mcmaster D, Pharm.D., BCPS Clinical Pharmacist 01/06/2016,12:02 PM

## 2016-01-06 NOTE — Progress Notes (Signed)
ANTICOAGULATION CONSULT NOTE - Initial Consult  Pharmacy Consult for Heparin  Indication: chest pain/ACS  Allergies  Allergen Reactions  . Vitamin D2 [Ergocalciferol] Other (See Comments)    This medication made the patient too hot.    Patient Measurements: Height: 5\' 11"  (180.3 cm) Weight: 207 lb 7.3 oz (94.1 kg) IBW/kg (Calculated) : 75.3 Heparin Dosing Weight: 81.6 kg   Vital Signs: Temp: 98.3 F (36.8 C) (04/17 0000) Temp Source: Oral (04/17 0000) BP: 172/100 mmHg (04/17 0400) Pulse Rate: 47 (04/17 0400)  Labs:  Recent Labs  01/05/16 0920 01/05/16 1650 01/05/16 1957 01/05/16 2327 01/06/16 0443  HGB 11.4*  --   --   --  12.2*  HCT 35.4*  --   --   --  37.6*  PLT 149*  --   --   --  153  APTT 28  --   --   --   --   LABPROT 15.5*  --   --   --   --   INR 1.21  --   --   --   --   HEPARINUNFRC  --   --  0.60  --  0.45  CREATININE 1.47* 1.87*  --   --  1.75*  TROPONINI 0.22* 2.16*  --  3.23*  --     Estimated Creatinine Clearance: 36.8 mL/min (by C-G formula based on Cr of 1.75).   Medical History: Past Medical History  Diagnosis Date  . Hypertension   . BPH (benign prostatic hyperplasia)   . Environmental allergies     Medications:  Prescriptions prior to admission  Medication Sig Dispense Refill Last Dose  . amLODipine (NORVASC) 10 MG tablet Take 10 mg by mouth daily.   01/04/2016 at Unknown time  . aspirin EC 81 MG tablet Take 81 mg by mouth daily.   01/04/2016 at Unknown time  . cetirizine (ZYRTEC) 10 MG tablet Take 10 mg by mouth daily.   01/04/2016 at Unknown time  . cloNIDine (CATAPRES) 0.1 MG tablet Take 0.1 mg by mouth 2 (two) times daily.   01/04/2016 at Unknown time  . doxazosin (CARDURA) 2 MG tablet Take 2 mg by mouth daily.   01/04/2016 at Unknown time  . fluticasone (FLONASE) 50 MCG/ACT nasal spray Place 2 sprays into both nostrils daily as needed. For sinus congestion.   01/04/2016 at Unknown time  . lisinopril (PRINIVIL,ZESTRIL) 40 MG tablet  Take 40 mg by mouth daily.   01/04/2016 at Unknown time    Assessment: Pharmacy consulted to dose heparin in this 82 year old male admitted with elevated cardiac enzymes.  CrCl = 39.8 ml/min, No prior anticoag noted.   Goal of Therapy:  Heparin level 0.3-0.7 units/ml Monitor platelets by anticoagulation protocol: Yes   Plan:  Second heparin level therapeutic. Continue current rate. Pharmacy will monitor daily.   Carola FrostNathan A Jamiria Langill, Pharm.D., BCPS Clinical Pharmacist 01/06/2016,5:39 AM

## 2016-01-07 ENCOUNTER — Encounter
Admission: EM | Disposition: A | Discharge status: Skilled Nursing Facility | Payer: Self-pay | Source: Home / Self Care | Attending: Internal Medicine

## 2016-01-07 ENCOUNTER — Inpatient Hospital Stay: Payer: Medicare Other

## 2016-01-07 DIAGNOSIS — I5021 Acute systolic (congestive) heart failure: Secondary | ICD-10-CM

## 2016-01-07 DIAGNOSIS — I429 Cardiomyopathy, unspecified: Secondary | ICD-10-CM

## 2016-01-07 DIAGNOSIS — I214 Non-ST elevation (NSTEMI) myocardial infarction: Secondary | ICD-10-CM

## 2016-01-07 LAB — BASIC METABOLIC PANEL
ANION GAP: 9 (ref 5–15)
BUN: 42 mg/dL — ABNORMAL HIGH (ref 6–20)
CALCIUM: 8.8 mg/dL — AB (ref 8.9–10.3)
CO2: 21 mmol/L — ABNORMAL LOW (ref 22–32)
Chloride: 108 mmol/L (ref 101–111)
Creatinine, Ser: 2.33 mg/dL — ABNORMAL HIGH (ref 0.61–1.24)
GFR calc Af Amer: 28 mL/min — ABNORMAL LOW (ref >60)
GFR, EST NON AFRICAN AMERICAN: 24 mL/min — AB (ref >60)
Glucose, Bld: 111 mg/dL — ABNORMAL HIGH (ref 65–99)
POTASSIUM: 3.8 mmol/L (ref 3.5–5.1)
SODIUM: 138 mmol/L (ref 135–145)

## 2016-01-07 LAB — CBC
HCT: 36.9 % — ABNORMAL LOW (ref 40.0–52.0)
Hemoglobin: 11.9 g/dL — ABNORMAL LOW (ref 13.0–18.0)
MCH: 28 pg (ref 26.0–34.0)
MCHC: 32.3 g/dL (ref 32.0–36.0)
MCV: 86.8 fL (ref 80.0–100.0)
PLATELETS: 135 10*3/uL — AB (ref 150–440)
RBC: 4.25 MIL/uL — AB (ref 4.40–5.90)
RDW: 16.3 % — AB (ref 11.5–14.5)
WBC: 10.7 10*3/uL — AB (ref 3.8–10.6)

## 2016-01-07 LAB — LIPID PANEL
CHOLESTEROL: 156 mg/dL (ref 0–200)
HDL: 66 mg/dL (ref >40)
LDL CALC: 77 mg/dL (ref 0–99)
TRIGLYCERIDES: 66 mg/dL (ref <150)
Total CHOL/HDL Ratio: 2.4 RATIO
VLDL: 13 mg/dL (ref 0–40)

## 2016-01-07 LAB — HEPARIN LEVEL (UNFRACTIONATED)
HEPARIN UNFRACTIONATED: 0.25 [IU]/mL — AB (ref 0.30–0.70)
HEPARIN UNFRACTIONATED: 0.35 [IU]/mL (ref 0.30–0.70)
Heparin Unfractionated: 0.33 IU/mL (ref 0.30–0.70)

## 2016-01-07 LAB — PHOSPHORUS: Phosphorus: 3.3 mg/dL (ref 2.5–4.6)

## 2016-01-07 LAB — MAGNESIUM: MAGNESIUM: 2.1 mg/dL (ref 1.7–2.4)

## 2016-01-07 SURGERY — LEFT HEART CATH AND CORONARY ANGIOGRAPHY
Anesthesia: Moderate Sedation | Laterality: Right

## 2016-01-07 MED ORDER — METOPROLOL SUCCINATE ER 25 MG PO TB24
12.5000 mg | ORAL_TABLET | Freq: Every day | ORAL | Status: DC
  Administered 2016-01-07: 12.5 mg via ORAL
  Filled 2016-01-07: qty 1

## 2016-01-07 MED ORDER — LEVALBUTEROL HCL 1.25 MG/0.5ML IN NEBU
1.2500 mg | INHALATION_SOLUTION | Freq: Four times a day (QID) | RESPIRATORY_TRACT | Status: DC | PRN
  Administered 2016-01-13: 1.25 mg via RESPIRATORY_TRACT
  Filled 2016-01-07 (×2): qty 0.5

## 2016-01-07 MED ORDER — METOPROLOL SUCCINATE ER 25 MG PO TB24
12.5000 mg | ORAL_TABLET | Freq: Two times a day (BID) | ORAL | Status: DC
  Administered 2016-01-07 – 2016-01-15 (×16): 12.5 mg via ORAL
  Filled 2016-01-07 (×9): qty 1
  Filled 2016-01-07: qty 2
  Filled 2016-01-07 (×6): qty 1

## 2016-01-07 MED ORDER — IPRATROPIUM BROMIDE 0.02 % IN SOLN
0.5000 mg | Freq: Three times a day (TID) | RESPIRATORY_TRACT | Status: AC
  Administered 2016-01-07 – 2016-01-09 (×5): 0.5 mg via RESPIRATORY_TRACT
  Filled 2016-01-07 (×5): qty 2.5

## 2016-01-07 MED ORDER — LEVALBUTEROL HCL 1.25 MG/0.5ML IN NEBU
1.2500 mg | INHALATION_SOLUTION | Freq: Three times a day (TID) | RESPIRATORY_TRACT | Status: AC
  Administered 2016-01-07 – 2016-01-09 (×5): 1.25 mg via RESPIRATORY_TRACT
  Filled 2016-01-07 (×5): qty 0.5

## 2016-01-07 MED ORDER — HEPARIN BOLUS VIA INFUSION
1400.0000 [IU] | Freq: Once | INTRAVENOUS | Status: AC
  Administered 2016-01-07: 1400 [IU] via INTRAVENOUS
  Filled 2016-01-07: qty 1400

## 2016-01-07 MED ORDER — IPRATROPIUM BROMIDE 0.02 % IN SOLN
0.5000 mg | Freq: Four times a day (QID) | RESPIRATORY_TRACT | Status: DC | PRN
  Filled 2016-01-07: qty 2.5

## 2016-01-07 MED ORDER — METOPROLOL SUCCINATE ER 25 MG PO TB24: 12.5000 mg | ORAL_TABLET | Freq: Two times a day (BID) | ORAL | Status: DC

## 2016-01-07 NOTE — Progress Notes (Addendum)
Spoke to Dr. Sherryl MangesSteven Klein, as patient needs transfer for AICD, but Dr. Brandy HaleMungle ICU intesivist thinks too unstable forr transfer. Cath not possible due to renal failure. Dr Brandy HaleMungle said he would talk to family about DNR.Also Dr. Juliann Parescallwood would be covering me after today.

## 2016-01-07 NOTE — Progress Notes (Signed)
ANTICOAGULATION CONSULT NOTE - Follow Up Consult  Pharmacy Consult for Heparin  Indication: chest pain/ACS  Allergies  Allergen Reactions  . Vitamin D2 [Ergocalciferol] Other (See Comments)    This medication made the patient too hot.    Patient Measurements: Height: 5\' 11"  (180.3 cm) Weight: 209 lb 10.5 oz (95.1 kg) IBW/kg (Calculated) : 75.3 Heparin Dosing Weight: 81.6 kg   Vital Signs: Temp: 98.8 F (37.1 C) (04/18 0000) Temp Source: Oral (04/18 0000) BP: 148/103 mmHg (04/18 0300) Pulse Rate: 49 (04/18 0300)  Labs:  Recent Labs  01/05/16 0920 01/05/16 1650  01/05/16 2327 01/06/16 0443 01/06/16 1036 01/06/16 2008 01/07/16 0431  HGB 11.4*  --   --   --  12.2* 12.0*  --  11.9*  HCT 35.4*  --   --   --  37.6* 36.5*  --  36.9*  PLT 149*  --   --   --  153 154  --  135*  APTT 28  --   --   --   --  30  --   --   LABPROT 15.5*  --   --   --   --  14.4  --   --   INR 1.21  --   --   --   --  1.10  --   --   HEPARINUNFRC  --   --   < >  --  0.45  --  0.24* 0.33  CREATININE 1.47* 1.87*  --   --  1.75* 2.05*  --   --   TROPONINI 0.22* 2.16*  --  3.23*  --   --   --   --   < > = values in this interval not displayed.  Estimated Creatinine Clearance: 31.6 mL/min (by C-G formula based on Cr of 2.05).   Medical History: Past Medical History  Diagnosis Date  . Hypertension   . BPH (benign prostatic hyperplasia)   . Environmental allergies     Medications:  Prescriptions prior to admission  Medication Sig Dispense Refill Last Dose  . amLODipine (NORVASC) 10 MG tablet Take 10 mg by mouth daily.   01/04/2016 at Unknown time  . aspirin EC 81 MG tablet Take 81 mg by mouth daily.   01/04/2016 at Unknown time  . cetirizine (ZYRTEC) 10 MG tablet Take 10 mg by mouth daily.   01/04/2016 at Unknown time  . cloNIDine (CATAPRES) 0.1 MG tablet Take 0.1 mg by mouth 2 (two) times daily.   01/04/2016 at Unknown time  . doxazosin (CARDURA) 2 MG tablet Take 2 mg by mouth daily.    01/04/2016 at Unknown time  . fluticasone (FLONASE) 50 MCG/ACT nasal spray Place 2 sprays into both nostrils daily as needed. For sinus congestion.   01/04/2016 at Unknown time  . lisinopril (PRINIVIL,ZESTRIL) 40 MG tablet Take 40 mg by mouth daily.   01/04/2016 at Unknown time    Assessment: Pharmacy consulted to dose heparin in this 82 year old male admitted with elevated cardiac enzymes.   Goal of Therapy:  Heparin level 0.3-0.7 units/ml Monitor platelets by anticoagulation protocol: Yes   Plan:  Heparin drip resumed after after patient returned from cancelled cath. Will resume heparin drip at previous rate and f/u heparin level in 8 hours.   4/17 at 20:08 HL = 0.24.  Ordered a bolus of 1200 units and increased drip rate to 1150 units/hr.   Next HL ordered for 4/18 at 04:00.    4/18 04:30  Heparin level 0.33. Continue current regimen and recheck in 8 hours to confirm.   Dannette Kinkaid S, RPh Clinical Pharmacist 01/07/2016,4:58 AM

## 2016-01-07 NOTE — Progress Notes (Signed)
[facility] [place] [facility]   PATIENT NAME: Jermaine Fields    MR#:  [id number]  DATE OF BIRTH:  12/26/1929  SUBJECTIVE:  CHIEF COMPLAINT:   Chief Complaint  Patient presents with  . Tachycardia   Cough and SOB, on HF O2, off BIPAP. Cardiac cath was canceled again due to respiratory distress today. REVIEW OF SYSTEMS:  CONSTITUTIONAL: No fever, has weakness.  EYES: No blurred or double vision.  EARS, NOSE, AND THROAT: No tinnitus or ear pain.  RESPIRATORY: has cough, shortness of breath, no wheezing or hemoptysis.  CARDIOVASCULAR: No chest pain, orthopnea, has leg edema.  GASTROINTESTINAL: No nausea, vomiting, diarrhea or abdominal pain.  GENITOURINARY: No dysuria, hematuria.  ENDOCRINE: No polyuria, nocturia,  HEMATOLOGY: No anemia, easy bruising or bleeding SKIN: No rash or lesion. MUSCULOSKELETAL: No joint pain or arthritis.   NEUROLOGIC: No tingling, numbness, weakness.  PSYCHIATRY: No anxiety or depression.   DRUG ALLERGIES:   Allergies  Allergen Reactions  . Vitamin D2 [Ergocalciferol] Other (See Comments)    This medication made the patient too hot.    VITALS:  Blood pressure 123/78, pulse 104, temperature 98.3 F (36.8 C), temperature source Oral, resp. rate 23, height 5\' 11"  (1.803 m), weight 95.1 kg (209 lb 10.5 oz), SpO2 97 %.  PHYSICAL EXAMINATION:  GENERAL:  82 y.o.-year-old patient lying in the bed with no acute distress. obese. EYES: Pupils equal, round, reactive to light and accommodation. No scleral icterus. Extraocular muscles intact.  HEENT: Head atraumatic, normocephalic. Oropharynx and nasopharynx clear.  NECK:  Supple, no jugular venous distention. No thyroid enlargement, no tenderness.  LUNGS: Normal breath sounds bilaterally, no wheezing, bilateral basilar rales, no rhonchi or crepitation. No use of accessory muscles of respiration.  CARDIOVASCULAR: S1, S2 normal. No murmurs, rubs, or gallops.  ABDOMEN: Soft,  nontender, nondistended. Bowel sounds present. No organomegaly or mass.  EXTREMITIES: bilateral leg edema 1 +, no cyanosis, or clubbing.  NEUROLOGIC: Cranial nerves II through XII are intact. Muscle strength 4/5 in all extremities. Sensation intact. Gait not checked.  PSYCHIATRIC: The patient is alert and oriented x 3.  SKIN: No obvious rash, lesion, or ulcer.    LABORATORY PANEL:   CBC  Recent Labs Lab 01/07/16 0431  WBC 10.7*  HGB 11.9*  HCT 36.9*  PLT 135*   ------------------------------------------------------------------------------------------------------------------  Chemistries   Recent Labs Lab 01/06/16 1036 01/07/16 0431  NA 137 138  K 3.8 3.8  CL 108 108  CO2 22 21*  GLUCOSE 138* 111*  BUN 32* 42*  CREATININE 2.05* 2.33*  CALCIUM 8.8* 8.8*  MG 2.2 2.1  AST 35  --   ALT 38  --   ALKPHOS 83  --   BILITOT 0.5  --    ------------------------------------------------------------------------------------------------------------------  Cardiac Enzymes  Recent Labs Lab 01/05/16 2327  TROPONINI 3.23*   ------------------------------------------------------------------------------------------------------------------  RADIOLOGY:  [place] Renal  01/06/2016  CLINICAL DATA:  Acute renal failure. EXAM: RENAL / URINARY TRACT ULTRASOUND COMPLETE COMPARISON:  None. FINDINGS: Right Kidney: Length: 12.9 cm. Echogenicity within normal limits. No mass or hydronephrosis visualized. Left Kidney: Length: 10.4 cm. Normal parenchymal echogenicity. No stone or hydronephrosis. Two cysts, 1 from the upper pole and 1 from the midpole. Midpole cyst measures 2.2 x 1.9 x 2.1 cm. Upper pole cyst measures 1.5 x 1.3 x 1.5 cm. No other renal masses. Bladder: Decompressed by a Foley catheter. IMPRESSION: 1. Normal parenchymal echogenicity.  No hydronephrosis. 2. 2 left renal cysts which appear to be  simple cysts on ultrasound. No other renal masses. Electronically Signed   By: Amie Portland M.D.    On: 01/06/2016 15:16   Dg Chest Port 1 View  01/07/2016  CLINICAL DATA:  Evaluation of respiratory distress. EXAM: PORTABLE CHEST 1 VIEW COMPARISON:  01/06/2016 FINDINGS: There is moderate cardiac enlargement. Bilateral pleural effusions are noted right greater than left. There is diffuse pulmonary edema identified. IMPRESSION: 1. CHF.  Unchanged from previous exam. Electronically Signed   By: Signa Kell M.D.   On: 01/07/2016 09:15   Dg Chest Port 1 View  01/06/2016  CLINICAL DATA:  Shortness of breath and tachycardia. Respiratory failure. EXAM: PORTABLE CHEST 1 VIEW COMPARISON:  Single view of the chest 01/05/2016 FINDINGS: Defibrillator pad remains in place. There is cardiomegaly. Extensive bilateral airspace disease appears worsened. There may be bilateral pleural effusions. IMPRESSION: Worsening bilateral airspace disease most consistent with increased pulmonary edema. Likely small to moderate bilateral pleural effusions noted. Electronically Signed   By: Drusilla Kanner M.D.   On: 01/06/2016 09:07    EKG:   Orders placed or performed during the hospital encounter of 01/05/16  . ED EKG  . ED EKG  . EKG 12-Lead  . EKG 12-Lead  . EKG 12-Lead  . EKG 12-Lead    ASSESSMENT AND PLAN:   1. Ventricular tachycardia with associated syncope and non-STEMI .   Status post electrical cardioversion of sustained ventricular tachycardia.  Transcutaneous defibrillation pads in place.  Dr. Welton Flakes was unable to get cardiac cath due to respiratory failure, continue Heparin drip for 48-72 H and amiodarone drip. Continue ASA, Toprol XL and lipitor,  normal lipid.  2. Acute systolic CHF, LV EF: 20% - 25%. Continue lasix, fluid restriction, Echo: LV EF: 20% - 25%. Lasix 40 mg iv one dose per Dr. Welton Flakes yesterday. Lasix is hold due to worsening ARF.  3.  Acute respiratory failure with hypoxia. OFF BIPAP, on HF. NEB. Continue lasix.  4. AKI. Due to prerenal or ATN.  Worsening, hold lasix. F/u BMP.   Renal US: Normal parenchymal echogenicity. No hydronephrosis. Hold lisinopril.  5. Hypokalemia. Improved with IV potassium.  6. BPH. continue doxazosin  6. Hypertension. hold lasix. Hold lisinopril,  norvasc and clonidine. Continue Toprol XL.   High risk for cardiopulmonary arrest. All the records are reviewed and case discussed with Care Management/Social Workerr. Management plans discussed with the patient, his wife and they are in agreement.  CODE STATUS: full code.  TOTAL CRITICAL TIME TAKING CARE OF THIS PATIENT: 42 minutes.  Greater than 50% time was spent on coordination of care and face-to-face counseling.  POSSIBLE D/C IN >3 DAYS, DEPENDING ON CLINICAL CONDITION.   Shaune Pollack M.D on 01/07/2016 at 12:15 PM  Between 7am to 6pm - Pager - [phone]  After 6pm go to [email or web address] - password [email or web address] [facility]  [facility]  Office  [phone]  CC: Primary care physician; Oswaldo Conroy, MD

## 2016-01-07 NOTE — Progress Notes (Signed)
Were unable to do cath as still unable to lye flat, and creatinine is over 2. Disccussed with Dr. Sherryl MangesSteven Klein to [facility]. Also Dr. Juliann Parescallwood would be covering me after today while in Hospital.

## 2016-01-07 NOTE — Progress Notes (Signed)
ANTICOAGULATION CONSULT NOTE - Follow Up Consult  Pharmacy Consult for Heparin  Indication: chest pain/ACS  Allergies  Allergen Reactions  . Vitamin D2 [Ergocalciferol] Other (See Comments)    This medication made the patient too hot.    Patient Measurements: Height: 5\' 11"  (180.3 cm) Weight: 209 lb 10.5 oz (95.1 kg) IBW/kg (Calculated) : 75.3 Heparin Dosing Weight: 81.6 kg   Vital Signs: Temp: 98.3 F (36.8 C) (04/18 1200) Temp Source: Oral (04/18 0800) BP: 166/63 mmHg (04/18 1200) Pulse Rate: 106 (04/18 1300)  Labs:  Recent Labs  01/05/16 0920 01/05/16 1650  01/05/16 2327 01/06/16 0443 01/06/16 1036 01/06/16 2008 01/07/16 0431 01/07/16 1314  HGB 11.4*  --   --   --  12.2* 12.0*  --  11.9*  --   HCT 35.4*  --   --   --  37.6* 36.5*  --  36.9*  --   PLT 149*  --   --   --  153 154  --  135*  --   APTT 28  --   --   --   --  30  --   --   --   LABPROT 15.5*  --   --   --   --  14.4  --   --   --   INR 1.21  --   --   --   --  1.10  --   --   --   HEPARINUNFRC  --   --   < >  --  0.45  --  0.24* 0.33 0.25*  CREATININE 1.47* 1.87*  --   --  1.75* 2.05*  --  2.33*  --   TROPONINI 0.22* 2.16*  --  3.23*  --   --   --   --   --   < > = values in this interval not displayed.  Estimated Creatinine Clearance: 27.8 mL/min (by C-G formula based on Cr of 2.33).   Medical History: Past Medical History  Diagnosis Date  . Hypertension   . BPH (benign prostatic hyperplasia)   . Environmental allergies     Medications:  Prescriptions prior to admission  Medication Sig Dispense Refill Last Dose  . amLODipine (NORVASC) 10 MG tablet Take 10 mg by mouth daily.   01/04/2016 at Unknown time  . aspirin EC 81 MG tablet Take 81 mg by mouth daily.   01/04/2016 at Unknown time  . cetirizine (ZYRTEC) 10 MG tablet Take 10 mg by mouth daily.   01/04/2016 at Unknown time  . cloNIDine (CATAPRES) 0.1 MG tablet Take 0.1 mg by mouth 2 (two) times daily.   01/04/2016 at Unknown time  .  doxazosin (CARDURA) 2 MG tablet Take 2 mg by mouth daily.   01/04/2016 at Unknown time  . fluticasone (FLONASE) 50 MCG/ACT nasal spray Place 2 sprays into both nostrils daily as needed. For sinus congestion.   01/04/2016 at Unknown time  . lisinopril (PRINIVIL,ZESTRIL) 40 MG tablet Take 40 mg by mouth daily.   01/04/2016 at Unknown time    Assessment: Pharmacy consulted to dose heparin in this 82 year old male admitted with elevated cardiac enzymes.   Goal of Therapy:  Heparin level 0.3-0.7 units/ml Monitor platelets by anticoagulation protocol: Yes HL= 0.25  Plan:  Will bolus heparin 1400 units iv once and increase infusion to 1350 units/hr. Will recheck a HL in 8 hours.    Valentina Guhristy, Alexandr Yaworski D, RPh Clinical Pharmacist 01/07/2016,2:17 PM

## 2016-01-07 NOTE — Consult Note (Signed)
PULMONARY / CRITICAL CARE MEDICINE   Name: Jermaine Fields MRN: [id number] DOB: 03/08/1930    ADMISSION DATE:  01/05/2016   CONSULTATION DATE:  01/05/2016  REFERRING MD:  ED  CHIEF COMPLAINT:  Ventricular tachycardia, presyncope and dyspnea  Brief history: A 82-year-old past medical history of hypertension, BPH, presented with near syncopal episode at home as well as chest pain. Arrival to the emergency room noted to be in ventricular tachycardia with rate in 180s, he was given adenosine and metoprolol without effect, he was then electrically cardioverted back into sinus rhythm, noted to have hypoxia in the 80s, placed on nonrebreather mask and brought to ICU for close observation and stabilizing of respiratory status before cardiac catheterization.   Subjective: Patient transitioning off BiPAP, now tolerating high flow nasal cannula. However, FiO2 is 100%. Patient saturating in the 90s. No acute respiratory distress with use of accessory muscles at this time. Patient states he is feeling much better today.  Noted with moderate diuresis last night, creatinine is increased today, patient seen by Dr. Welton FlakesKhan (cardiology), was planning for a cath today, but patient cannot lay flat. Cardiology also stated patient may need a defibrillator and possible [facility], Dr. Welton FlakesKhan has transferred care to Dr. Juliann Paresallwood, and consulted EP (Dr. Graciela HusbandsKlein) for ICD evaluation.   REVIEW OF SYSTEMS:   Constitutional: Reports generalized weakness but denies fever and chills HENT: Negative for congestion and rhinorrhea.  Eyes: Negative for redness and visual disturbance.  Respiratory: Positive for shortness of breath but negative for wheezing.  Cardiovascular: Positive for chest pain, dizziness and palpitations.  Gastrointestinal: Negative  for nausea , vomiting and abdominal pain and loose stools Genitourinary: Negative for dysuria and urgency.  Musculoskeletal: Negative for myalgias and arthralgias but  positive for gait instability and falls.  Skin: Negative for pallor and wound.  Neurological: Positive for dizziness but negative for headaches    VITAL SIGNS: BP 144/77 mmHg  Pulse 47  Temp(Src) 98.3 F (36.8 C) (Oral)  Resp 22  Ht 5\' 11"  (1.803 m)  Wt 209 lb 10.5 oz (95.1 kg)  BMI 29.25 kg/m2  SpO2 95%     VENTILATOR SETTINGS: Vent Mode:  [-]  FiO2 (%):  [100 %] 100 %  INTAKE / OUTPUT: I/O last 3 completed shifts: In: 805.9 [I.V.:805.9] Out: 1895 [Urine:1895]  PHYSICAL EXAMINATION: General:  Well nourished,resp distress Neuro:  AAO X3, speech normal, no focal deficits HEENT: PERRLA, Oral mucosa moist and pink, BiPAP mask Cardiovascular:  NSR with short runs of V tach in the low 100s, S1/S2, no murmur, regurg or gallop Lungs: Bilateral airflow, crackles in bilateral lower lung fields (improving) Abdomen:  Non-distended, normal bowel sounds Musculoskeletal:  No joint deformities, +rom Extremities:+2 edema, +2 pulses Skin: warm and dry; venous stasis discoloration in bilateral lower extremities  LABS:  BMET  Recent Labs Lab 01/06/16 0443 01/06/16 1036 01/07/16 0431  NA 137 137 138  K 3.5 3.8 3.8  CL 108 108 108  CO2 20* 22 21*  BUN 30* 32* 42*  CREATININE 1.75* 2.05* 2.33*  GLUCOSE 122* 138* 111*    Electrolytes  Recent Labs Lab 01/05/16 1957 01/06/16 0443 01/06/16 1036 01/07/16 0431  CALCIUM  --  8.8* 8.8* 8.8*  MG 1.9  --  2.2 2.1  PHOS  --   --  3.2 3.3    CBC  Recent Labs Lab 01/06/16 0443 01/06/16 1036 01/07/16 0431  WBC 11.8* 11.7* 10.7*  HGB 12.2* 12.0* 11.9*  HCT 37.6* 36.5* 36.9*  PLT 153 154 135*    Coag's  Recent Labs Lab 01/05/16 0920 01/06/16 1036  APTT 28 30  INR 1.21 1.10    Sepsis Markers No results for input(s): LATICACIDVEN, PROCALCITON, O2SATVEN in the last 168 hours.  ABG  Recent Labs Lab 01/05/16 0950  PHART 7.34*  PCO2ART 38  PO2ART 61*    Liver Enzymes  Recent Labs Lab 01/05/16 0920  01/06/16 1036  AST 24 35  ALT 22 38  ALKPHOS 71 83  BILITOT 0.8 0.5  ALBUMIN 3.0* 3.1*    Cardiac Enzymes  Recent Labs Lab 01/05/16 0920 01/05/16 1650 01/05/16 2327  TROPONINI 0.22* 2.16* 3.23*    Glucose  Recent Labs Lab 01/05/16 1808  GLUCAP 144*    Imaging US Renal  01/06/2016  CLINICAL DATA:  Acute renal failure. EXAM: RENAL / URINARY TRACT ULTRASOUND COMPLETE COMPARISON:  None. FINDINGS: Right Kidney: Length: 12.9 cm. Echogenicity within normal limits. No mass or hydronephrosis visualized. Left Kidney: Length: 10.4 cm. Normal parenchymal echogenicity. No stone or hydronephrosis. Two cysts, 1 from the upper pole and 1 from the midpole. Midpole cyst measures 2.2 x 1.9 x 2.1 cm. Upper pole cyst measures 1.5 x 1.3 x 1.5 cm. No other renal masses. Bladder: Decompressed by a Foley catheter. IMPRESSION: 1. Normal parenchymal echogenicity.  No hydronephrosis. 2. 2 left renal cysts which appear to be simple cysts on ultrasound. No other renal masses. Electronically Signed   By: Amie Portland M.D.   On: 01/06/2016 15:16   Dg Chest Port 1 View  01/07/2016  CLINICAL DATA:  Evaluation of respiratory distress. EXAM: PORTABLE CHEST 1 VIEW COMPARISON:  01/06/2016 FINDINGS: There is moderate cardiac enlargement. Bilateral pleural effusions are noted right greater than left. There is diffuse pulmonary edema identified. IMPRESSION: 1. CHF.  Unchanged from previous exam. Electronically Signed   By: Signa Kell M.D.   On: 01/07/2016 09:15   STUDIES:  Cardiac acth 04/17 Echo 04/17>>EF 20-25%, mild LA dilation, moderate RV dilation, 4 chamber dilation with severe LV dysfunction  CULTURES: MRSA screen negative  ANTIBIOTICS: None  SIGNIFICANT EVENTS: 04/16>ED with dyspnea, near-syncope>SVT>Cardioverted  LINES/TUBES: PIVs  DISCUSSION: 82 YO AA male with acute hypoxic respiratory failure 2/2 volume overload, VT s/p cardioversion now in NSR, chest pain, near syncope and elevated  troponin likely acute CHF exacerbation, no cath today due to inability to lay flat   ASSESSMENT / PLAN:  PULMONARY A: Acute respiratory failure Pulmonary edema P:   On biPAP (intermittent) and HFNC -IV duiresis (gentle) -high risk for intubation/cardiac arrest - overall improvement today in resp status, however, cannot lay flat.  - would recommend palliative and DNR status, given overall health and clinical status.   - high risk intubation for cardiac arrest, if needed for elective procedures.   CARDIOVASCULAR A:  VT s/p cardioversion Hypertension Elevated troponin Near-syncope P:  -Cardiology following -Amio and heparin gttes per cardiology -spoke with Dr. Welton Flakes, will be evaluated by EP (Dr. Graciela Husbands), for ICD evaluation  -Cycle cardiac enzymes -prn hydralazine for SBP>170  RENAL A:   AKI - worsening Hypomagnesemia Hypokalemia P:   -Monitor creatinine -avoid nephro toxic drugs.  -Monitor and replace electrolytes  HEMATOLOGIC A:   Anemia Thrombocytopenia P:  -Monitor CBC  ENDOCRINE A:   Mild hyperglycemia without a diagnosis of DM P:   -Monitor blood glucose with BMP  NEUROLOGIC A:   Acute encephalopathy-resolving P:   RASS goal: n/a -Monitor mental status     Best Practice: Code Status:  Full.  Diet: NPO except for sips and meds GI prophylaxis: not indicated VTE prophylaxis:  SCD's / already on full strength heparin.   I have personally obtained a history, examined the patient, evaluated Pertinent laboratory and RadioGraphic/imaging results, and  formulated the assessment and plan   The Patient requires high complexity decision making for assessment and support, frequent evaluation and titration of therapies, application of advanced monitoring technologies and extensive interpretation of multiple databases. Critical Care Time devoted to patient care services described in this note is 40 minutes.   Overall, patient is critically ill, prognosis is  guarded.  Patient with Multiorgan failure and at high risk for cardiac arrest and death.    Stephanie Acre, MD [facility] Pager [phone] (please enter 7-digits) On Call Pager - [phone] (please enter 7-digits)

## 2016-01-07 NOTE — Consult Note (Signed)
Cardiology Consultation Note  Patient ID: Jermaine Fields, MRN: [id number], DOB/AGE: 1930-08-10 82 y.o. Admit date: 01/05/2016   Date of Consult: 01/07/2016 Primary Physician: Oswaldo Conroy, MD Primary Cardiologist: Dr. Welton Flakes, MD  Chief Complaint: SOB Reason for Consult: VT, evaluation for possible ICD  HPI: 82 y.o. male with h/o HTN, BPH, environmental allergies, newly found acute systolic CHF with EF of 20-25% by echo on 01/06/2016, monomorphic VT s/p emergent DCCV on 01/05/2016 who presented to [facility] on 01/05/2016 with generally not feeling well and was found to be in monomorphic VT as above.    He does not have any previously known cardiac history and has never had a cardiac catheterization. Patient presented to [facility] on 4/17 as above with complaints of generalized fatigue, generally not feeling well, dizziness, and chest pain. He was noted to be tachycardic in triage with a HR in the 180's bpm. This was initially felt to be SVT in the ED that did not respond to vagal maneuvers, adenosine, or metoprolol. Outside cardiology group saw the patient, reviewed the telemetry strips and noted the patient to be in monomorphic VT with HR of 180 bpm. He subsequently became unresponsive. He underwent emergent DCCV with conversion to sinus rhythm. He was started on amiodarone infusion. He has been in respiratory distress requiring NRB/HFNC since his admission. He was scheduled for cardiac cath this morning however upon arriving to the cath lab he was unable to proceed with the procedure 2/2 acute respiratory distress requiring IV Lasix. Echo was checked and showed EF 20-25% (no priors), GR1DD, mild MR, LA mildly dilated, PASP 38 mm Hg. His renal funciton has worsened with mild diuresis from 1.75 upon admission to 2.33 today. Troponin was initially mildly elevated at 0.22, though has trended up to 3.23. He is on heparin gtt. BNP was found to be 1339. He has being having increased ventricular ectopy on telemetry with  frequent PVCs and NSVT this morning. He continues to require HFNC. Magnesium was found to be 2.2-->2.1. Potassium 3.5-->3.8. WBC 11.8-->10.7. CXR from 4/16 showed cardiomegaly with chronic interstitial lung disease. Follow CXR from today showed unchanged findings with CHF. CT head with no evidence of acute intracranial abnormality. EKG not in chart or scanned in for review.    Past Medical History  Diagnosis Date  . Hypertension   . BPH (benign prostatic hyperplasia)   . Environmental allergies       Most Recent Cardiac Studies: Echo 01/06/2016: Study Conclusions - Left ventricle: Systolic function was severely reduced. The  estimated ejection fraction was in the range of 20% to 25%.  Doppler parameters are consistent with abnormal left ventricular  relaxation (grade 1 diastolic dysfunction). - Aortic valve: Valve area (Vmax): 2.43 cm^2. - Mitral valve: There was mild regurgitation. - Left atrium: The atrium was mildly dilated. - Right ventricle: The cavity size was moderately dilated. - Pulmonary arteries: PA peak pressure: 38 mm Hg (S). Impressions: - The right ventricular systolic pressure was increased consistent  with mild pulmonary hypertension. 4 chamber dilatation with  severe LV dysfunction.   Surgical History:  Past Surgical History  Procedure Laterality Date  . Cholecystectomy       Home Meds: Prior to Admission medications   Medication Sig Start Date End Date Taking? Authorizing Provider  amLODipine (NORVASC) 10 MG tablet Take 10 mg by mouth daily. 12/21/15  Yes Historical Provider, MD  aspirin EC 81 MG tablet Take 81 mg by mouth daily.   Yes Historical Provider, MD  cetirizine (  ZYRTEC) 10 MG tablet Take 10 mg by mouth daily.   Yes Historical Provider, MD  cloNIDine (CATAPRES) 0.1 MG tablet Take 0.1 mg by mouth 2 (two) times daily.   Yes Historical Provider, MD  doxazosin (CARDURA) 2 MG tablet Take 2 mg by mouth daily. 10/22/15  Yes Historical Provider, MD   fluticasone (FLONASE) 50 MCG/ACT nasal spray Place 2 sprays into both nostrils daily as needed. For sinus congestion. 12/21/15  Yes Historical Provider, MD  lisinopril (PRINIVIL,ZESTRIL) 40 MG tablet Take 40 mg by mouth daily. 12/21/15  Yes Historical Provider, MD    Inpatient Medications:  . antiseptic oral rinse  7 mL Mouth Rinse BID  . aspirin EC  325 mg Oral Daily  . atorvastatin  40 mg Oral q1800  . doxazosin  2 mg Oral Daily  . furosemide  20 mg Intravenous Q12H  . ipratropium  0.5 mg Nebulization Q8H   And  . levalbuterol  1.25 mg Nebulization Q8H  . loratadine  10 mg Oral Daily  . metoprolol succinate  12.5 mg Oral Daily  . sodium chloride flush  3 mL Intravenous Q12H   . amiodarone 30 mg/hr (01/07/16 0418)  . heparin 1,150 Units/hr (01/06/16 2100)    Allergies:  Allergies  Allergen Reactions  . Vitamin D2 [Ergocalciferol] Other (See Comments)    This medication made the patient too hot.    Social History   Social History  . Marital Status: Married    Spouse Name: N/A  . Number of Children: N/A  . Years of Education: N/A   Occupational History  . Not on file.   Social History Main Topics  . Smoking status: Former Games developer  . Smokeless tobacco: Not on file  . Alcohol Use: No  . Drug Use: No  . Sexual Activity: Not on file   Other Topics Concern  . Not on file   Social History Narrative  . No narrative on file     Family History  Problem Relation Age of Onset  . Hypertension       Review of Systems: Review of Systems  Constitutional: Positive for malaise/fatigue. Negative for fever, chills, weight loss and diaphoresis.  HENT: Negative for congestion.   Eyes: Negative for discharge and redness.  Respiratory: Positive for cough, sputum production and shortness of breath. Negative for hemoptysis and wheezing.   Cardiovascular: Positive for chest pain, palpitations and leg swelling. Negative for orthopnea, claudication and PND.  Gastrointestinal:  Negative for heartburn, nausea, vomiting and abdominal pain.  Musculoskeletal: Negative for myalgias and falls.  Skin: Negative for rash.  Neurological: Positive for dizziness, loss of consciousness and weakness. Negative for tingling, tremors, sensory change, speech change, focal weakness and seizures.  Endo/Heme/Allergies: Does not bruise/bleed easily.  Psychiatric/Behavioral: The patient is not nervous/anxious.   All other systems reviewed and are negative.   Labs:  Recent Labs  01/05/16 0920 01/05/16 1650 01/05/16 2327  TROPONINI 0.22* 2.16* 3.23*   Lab Results  Component Value Date   WBC 10.7* 01/07/2016   HGB 11.9* 01/07/2016   HCT 36.9* 01/07/2016   MCV 86.8 01/07/2016   PLT 135* 01/07/2016    Recent Labs Lab 01/06/16 1036 01/07/16 0431  NA 137 138  K 3.8 3.8  CL 108 108  CO2 22 21*  BUN 32* 42*  CREATININE 2.05* 2.33*  CALCIUM 8.8* 8.8*  PROT 6.9  --   BILITOT 0.5  --   ALKPHOS 83  --   ALT 38  --  AST 35  --   GLUCOSE 138* 111*   Lab Results  Component Value Date   CHOL 156 01/07/2016   HDL 66 01/07/2016   LDLCALC 77 01/07/2016   TRIG 66 01/07/2016   No results found for: DDIMER  Radiology/Studies:  Dg Chest 1 View  01/05/2016  CLINICAL DATA:  Weakness and dizziness. Larey SeatFell out of his chair this morning. EXAM: CHEST 1 VIEW COMPARISON:  None. FINDINGS: Enlarged cardiac silhouette. Prominent interstitial markings. Normal vasculature. No pleural fluid. Diffuse osteopenia. No fracture or pneumothorax seen. IMPRESSION: Cardiomegaly and chronic interstitial lung disease. Electronically Signed   By: Beckie SaltsSteven  Reid M.D.   On: 01/05/2016 10:12   Ct Head Wo Contrast  01/05/2016  CLINICAL DATA:  Altered mental status. Weakness and dizziness. Tachycardia. EXAM: CT HEAD WITHOUT CONTRAST TECHNIQUE: Contiguous axial images were obtained from the base of the skull through the vertex without intravenous contrast. COMPARISON:  Report from 10/03/2001 head CT (images not  available). FINDINGS: No evidence of parenchymal hemorrhage or extra-axial fluid collection. No mass lesion, mass effect, or midline shift. No CT evidence of acute infarction. Intracranial atherosclerosis. Nonspecific mild subcortical and periventricular white matter hypodensity, most in keeping with chronic small vessel ischemic change. Small bilateral thalamic lacunar infarcts. Cerebral volume is age appropriate. No ventriculomegaly. The visualized paranasal sinuses are essentially clear. The mastoid air cells are unopacified. No evidence of calvarial fracture. IMPRESSION: 1.  No evidence of acute intracranial abnormality. 2. Intracranial atherosclerosis, mild chronic small vessel ischemia and small bilateral thalamic lacunes. Electronically Signed   By: Delbert PhenixJason A Poff M.D.   On: 01/05/2016 11:07   [place] Renal  01/06/2016  CLINICAL DATA:  Acute renal failure. EXAM: RENAL / URINARY TRACT ULTRASOUND COMPLETE COMPARISON:  None. FINDINGS: Right Kidney: Length: 12.9 cm. Echogenicity within normal limits. No mass or hydronephrosis visualized. Left Kidney: Length: 10.4 cm. Normal parenchymal echogenicity. No stone or hydronephrosis. Two cysts, 1 from the upper pole and 1 from the midpole. Midpole cyst measures 2.2 x 1.9 x 2.1 cm. Upper pole cyst measures 1.5 x 1.3 x 1.5 cm. No other renal masses. Bladder: Decompressed by a Foley catheter. IMPRESSION: 1. Normal parenchymal echogenicity.  No hydronephrosis. 2. 2 left renal cysts which appear to be simple cysts on ultrasound. No other renal masses. Electronically Signed   By: Amie Portlandavid  Ormond M.D.   On: 01/06/2016 15:16   Dg Chest Port 1 View  01/07/2016  CLINICAL DATA:  Evaluation of respiratory distress. EXAM: PORTABLE CHEST 1 VIEW COMPARISON:  01/06/2016 FINDINGS: There is moderate cardiac enlargement. Bilateral pleural effusions are noted right greater than left. There is diffuse pulmonary edema identified. IMPRESSION: 1. CHF.  Unchanged from previous exam. Electronically  Signed   By: Signa Kellaylor  Stroud M.D.   On: 01/07/2016 09:15   Dg Chest Port 1 View  01/06/2016  CLINICAL DATA:  Shortness of breath and tachycardia. Respiratory failure. EXAM: PORTABLE CHEST 1 VIEW COMPARISON:  Single view of the chest 01/05/2016 FINDINGS: Defibrillator pad remains in place. There is cardiomegaly. Extensive bilateral airspace disease appears worsened. There may be bilateral pleural effusions. IMPRESSION: Worsening bilateral airspace disease most consistent with increased pulmonary edema. Likely small to moderate bilateral pleural effusions noted. Electronically Signed   By: Drusilla Kannerhomas  Dalessio M.D.   On: 01/06/2016 09:07    EKG: as above, not available for review  Weights: Filed Weights   01/05/16 0914 01/05/16 1808 01/06/16 0630  Weight: 180 lb (81.647 kg) 207 lb 7.3 oz (94.1 kg) 209 lb 10.5 oz (  95.1 kg)     Physical Exam: Blood pressure 123/78, pulse 104, temperature 98.3 F (36.8 C), temperature source Oral, resp. rate 23, height  (1.803 m), weight 209 lb 10.5 oz (95.1 kg), SpO2 97 %. Body mass index is 29.25 kg/(m^2). General: Critically ill, in no acute distress. Head: Normocephalic, atraumatic, sclera non-icteric, no xanthomas, nares are without discharge.  Neck: Negative for carotid bruits. JVD not elevated. Lungs: Decreased breath sounds bilaterally. Breathing is mildly labored. Heart: Tachycardic, irregularly-irregular, with S1 S2. No murmurs, rubs, or gallops appreciated. Abdomen: Soft, non-tender, non-distended with normoactive bowel sounds. No hepatomegaly. No rebound/guarding. No obvious abdominal masses. Msk:  Strength and tone appear normal for age. Extremities: No clubbing or cyanosis. 1+ bilateral pitting edema along LE.   Neuro: Alert and oriented X 3. No facial asymmetry. No focal deficit. Moves all extremities spontaneously. Psych:  Responds to questions appropriately with a normal affect.    Assessment and Plan:   1. Acute respiratory failure with  hypoxia requiring HFNC: -Wean oxygen as able -Likely in the setting of #3  2. Ventricular tachycardia with associated syncope: -Status post emergent DCCV 01/05/2016 -Transcutaneous defibrillation pads in place -Likely ICM given #4 -Increased ventricular ectopy on telemetry this morning, check stat EKG to assess QTc. If increasing may need to discontinue IV amiodarone infusion and manage ectopy with Toprol XL only -At this time he is too unstable to be transported to [facility] for further evaluation of possible ICD placement without further intervention/treamtnet here at [facility] such as elective intubation. It is unclear at this time even if the family would want this. Secondly, given the increased ventricular ectopy seen on telemetry this may also preclude him from being transported at this time  3. Acute decompensated systolic CHF: -Unable to further diurese at this time given acute renal failure -IV Lasix on hold -As renal function improves could start Lasix gtt -Toprol XL as above -If family wishes for continued care, primary cardiology team could pursue gentle diuresis and have patient fitted for LifeVest prior to discharge vs transfer to [facility] for ICD placement  4. NSTEMI: -Unclear if this is stress induced at this time -Unable to have cardiac catheterization this morning 2/2 acute respiratory distress -Echo as above with EF 20-25% -On heparin gtt, would treat for 48-72 hours -Aspirin -Lipitor   5. Acute renal failure: -Worsening with IV Lasix -Hold diuresis at this time -Possible Lasix gtt 4/19 if renal function allows  6. Accelerated HTN: -Labile -Per primary    Signed, Eula Listen, PA-C Pager: [phone] 01/07/2016, 10:57 AM   Will try and get ECG to clarify tachycardia mechanism He has had crescendo symptoms of angina and shortness of breath over recent weeks and months. I agree with Jermaine Fields the catheterization is indicated. Revascularization would follow.  In the  Event  that his cardiomyopathy is nonischemic would worry about the potential contributing factor of the PVC burden which on telemetry is very high. In this regard amiodarone PVC suppression is appropriate   For now-- 1) continue amio for frequent ventricular ectopy 2) catheterization when stable 3) device recommendations to follow the above 4) reviewed with the family and we'll see again on Thursday

## 2016-01-07 NOTE — Progress Notes (Signed)
SUBJECTIVE: Patient is off BiPAP and is feeling much better less short of breath   Filed Vitals:   01/07/16 0400 01/07/16 0500 01/07/16 0600 01/07/16 0720  BP: 156/67 116/75 146/83   Pulse: 63 48 49   Temp:      TempSrc:      Resp: 23 20 33   Height:      Weight:      SpO2: 94% 95% 93% 94%    Intake/Output Summary (Last 24 hours) at 01/07/16 0901 Last data filed at 01/07/16 0600  Gross per 24 hour  Intake 465.83 ml  Output    995 ml  Net -529.17 ml    LABS: Basic Metabolic Panel:  Recent Labs  40/98/1104/17/17 1036 01/07/16 0431  NA 137 138  K 3.8 3.8  CL 108 108  CO2 22 21*  GLUCOSE 138* 111*  BUN 32* 42*  CREATININE 2.05* 2.33*  CALCIUM 8.8* 8.8*  MG 2.2 2.1  PHOS 3.2 3.3   Liver Function Tests:  Recent Labs  01/05/16 0920 01/06/16 1036  AST 24 35  ALT 22 38  ALKPHOS 71 83  BILITOT 0.8 0.5  PROT 6.6 6.9  ALBUMIN 3.0* 3.1*   No results for input(s): LIPASE, AMYLASE in the last 72 hours. CBC:  Recent Labs  01/06/16 1036 01/07/16 0431  WBC 11.7* 10.7*  NEUTROABS 9.7*  --   HGB 12.0* 11.9*  HCT 36.5* 36.9*  MCV 85.3 86.8  PLT 154 135*   Cardiac Enzymes:  Recent Labs  01/05/16 0920 01/05/16 1650 01/05/16 2327  TROPONINI 0.22* 2.16* 3.23*   BNP: Invalid input(s): POCBNP D-Dimer: No results for input(s): DDIMER in the last 72 hours. Hemoglobin A1C: No results for input(s): HGBA1C in the last 72 hours. Fasting Lipid Panel:  Recent Labs  01/07/16 0431  CHOL 156  HDL 66  LDLCALC 77  TRIG 66  CHOLHDL 2.4   Thyroid Function Tests:  Recent Labs  01/05/16 0920  TSH 1.713   Anemia Panel: No results for input(s): VITAMINB12, FOLATE, FERRITIN, TIBC, IRON, RETICCTPCT in the last 72 hours.   PHYSICAL EXAM General: Well developed, well nourished, in no acute distress HEENT:  Normocephalic and atramatic Neck:  No JVD.  Lungs: Clear bilaterally to auscultation and percussion. Heart: HRRR . Normal S1 and S2 without gallops or murmurs.   Abdomen: Bowel sounds are positive, abdomen soft and non-tender  Msk:  Back normal, normal gait. Normal strength and tone for age. Extremities: No clubbing, cyanosis or edema.   Neuro: Alert and oriented X 3. Psych:  Good affect, responds appropriately  TELEMETRY:Sinus rhythm  ASSESSMENT AND PLAN: Sustained ventricular tachycardia status post cardioversion with left ventricle ejection fraction severely depressed approximately 25-30% on echocardiogram. Will get Dr. Sherryl MangesSteven Klein to see the patient for possible defibrillator [facility] Also we will do a limited cardiac catheterization today with just injection of right and left coronaries to make sure patient doesn't have three-vessel disease. Renal insufficiency has progressed due to diuresis. Patient was made aware of all that. He has agreed to the procedure.  Principal Problem:   Ventricular tachycardia (HCC) Active Problems:   Acute respiratory failure (HCC)   Acute systolic CHF (congestive heart failure) (HCC)   NSTEMI (non-ST elevated myocardial infarction) (HCC)    Jermaine BlackwaterKHAN,Franciszek Platten A, MD, [facility] 01/07/2016 9:01 AM     The top

## 2016-01-08 LAB — BASIC METABOLIC PANEL
ANION GAP: 8 (ref 5–15)
BUN: 48 mg/dL — ABNORMAL HIGH (ref 6–20)
CO2: 24 mmol/L (ref 22–32)
Calcium: 8.3 mg/dL — ABNORMAL LOW (ref 8.9–10.3)
Chloride: 104 mmol/L (ref 101–111)
Creatinine, Ser: 2.47 mg/dL — ABNORMAL HIGH (ref 0.61–1.24)
GFR, EST AFRICAN AMERICAN: 26 mL/min — AB (ref >60)
GFR, EST NON AFRICAN AMERICAN: 22 mL/min — AB (ref >60)
GLUCOSE: 108 mg/dL — AB (ref 65–99)
POTASSIUM: 3.6 mmol/L (ref 3.5–5.1)
Sodium: 136 mmol/L (ref 135–145)

## 2016-01-08 LAB — CBC
HEMATOCRIT: 31.9 % — AB (ref 40.0–52.0)
HEMOGLOBIN: 10.6 g/dL — AB (ref 13.0–18.0)
MCH: 29.2 pg (ref 26.0–34.0)
MCHC: 33.2 g/dL (ref 32.0–36.0)
MCV: 87.8 fL (ref 80.0–100.0)
Platelets: 120 10*3/uL — ABNORMAL LOW (ref 150–440)
RBC: 3.63 MIL/uL — AB (ref 4.40–5.90)
RDW: 16.7 % — ABNORMAL HIGH (ref 11.5–14.5)
WBC: 9.7 10*3/uL (ref 3.8–10.6)

## 2016-01-08 LAB — HEPARIN LEVEL (UNFRACTIONATED): HEPARIN UNFRACTIONATED: 0.27 [IU]/mL — AB (ref 0.30–0.70)

## 2016-01-08 MED ORDER — HEPARIN SODIUM (PORCINE) 5000 UNIT/ML IJ SOLN
5000.0000 [IU] | Freq: Three times a day (TID) | INTRAMUSCULAR | Status: DC
  Administered 2016-01-08 – 2016-01-15 (×21): 5000 [IU] via SUBCUTANEOUS
  Filled 2016-01-08 (×21): qty 1

## 2016-01-08 MED ORDER — HEPARIN BOLUS VIA INFUSION
1400.0000 [IU] | Freq: Once | INTRAVENOUS | Status: AC
  Administered 2016-01-08: 1400 [IU] via INTRAVENOUS
  Filled 2016-01-08: qty 1400

## 2016-01-08 NOTE — Progress Notes (Signed)
Palliative Care Update  I am aware of consult.  Notes reflect that Dr Renae GlossWieting confirmed that family wanted FULL CODE and current care to continue.    I will check back tomorrow and talk with Dr. Renae GlossWieting about his conversation with family before approaching them again.   Suan HalterMargaret F Lonell Stamos, MD Palliative Care

## 2016-01-08 NOTE — Progress Notes (Signed)
ANTICOAGULATION CONSULT NOTE - Follow Up Consult  Pharmacy Consult for Heparin  Indication: chest pain/ACS  Allergies  Allergen Reactions  . Vitamin D2 [Ergocalciferol] Other (See Comments)    This medication made the patient too hot.    Patient Measurements: Height: 5\' 11"  (180.3 cm) Weight: 209 lb 10.5 oz (95.1 kg) IBW/kg (Calculated) : 75.3 Heparin Dosing Weight: 81.6 kg   Vital Signs: Temp: 98.7 F (37.1 C) (04/19 0700) Temp Source: Oral (04/19 0000) BP: 138/64 mmHg (04/19 0900) Pulse Rate: 93 (04/19 0900)  Labs:  Recent Labs  01/05/16 1650  01/05/16 2327  01/06/16 1036  01/07/16 0431 01/07/16 1314 01/07/16 2330 01/08/16 0411 01/08/16 0929  HGB  --   --   --   < > 12.0*  --  11.9*  --   --  10.6*  --   HCT  --   --   --   < > 36.5*  --  36.9*  --   --  31.9*  --   PLT  --   --   --   < > 154  --  135*  --   --  120*  --   APTT  --   --   --   --  30  --   --   --   --   --   --   LABPROT  --   --   --   --  14.4  --   --   --   --   --   --   INR  --   --   --   --  1.10  --   --   --   --   --   --   HEPARINUNFRC  --   < >  --   < >  --   < > 0.33 0.25* 0.35  --  0.27*  CREATININE 1.87*  --   --   < > 2.05*  --  2.33*  --   --  2.47*  --   TROPONINI 2.16*  --  3.23*  --   --   --   --   --   --   --   --   < > = values in this interval not displayed.  Estimated Creatinine Clearance: 26.2 mL/min (by C-G formula based on Cr of 2.47).   Medical History: Past Medical History  Diagnosis Date  . Hypertension   . BPH (benign prostatic hyperplasia)   . Environmental allergies     Medications:  Prescriptions prior to admission  Medication Sig Dispense Refill Last Dose  . amLODipine (NORVASC) 10 MG tablet Take 10 mg by mouth daily.   01/04/2016 at Unknown time  . aspirin EC 81 MG tablet Take 81 mg by mouth daily.   01/04/2016 at Unknown time  . cetirizine (ZYRTEC) 10 MG tablet Take 10 mg by mouth daily.   01/04/2016 at Unknown time  . cloNIDine (CATAPRES) 0.1  MG tablet Take 0.1 mg by mouth 2 (two) times daily.   01/04/2016 at Unknown time  . doxazosin (CARDURA) 2 MG tablet Take 2 mg by mouth daily.   01/04/2016 at Unknown time  . fluticasone (FLONASE) 50 MCG/ACT nasal spray Place 2 sprays into both nostrils daily as needed. For sinus congestion.   01/04/2016 at Unknown time  . lisinopril (PRINIVIL,ZESTRIL) 40 MG tablet Take 40 mg by mouth daily.   01/04/2016 at Unknown time  Assessment: Pharmacy consulted to dose heparin in this 82 year old male admitted with elevated cardiac enzymes.   Goal of Therapy:  Heparin level 0.3-0.7 units/ml Monitor platelets by anticoagulation protocol: Yes HL= 0.27  Plan:  Will bolus heparin 1400 units iv once and increase infusion to 1550 units/hr. Will recheck a HL in 8 hours.    Valentina Gu, [place] Clinical Pharmacist 01/08/2016,11:04 AM

## 2016-01-08 NOTE — Progress Notes (Signed)
Patient ID: Jermaine Fields, male   DOB: 06/09/1930, 82 y.o.   MRN: [id number] [facility] PROGRESS NOTE  Jermaine GuileWillie Ausburn WUJ:[id number] DOB: 06/16/1930 DOA: 01/05/2016 PCP: Oswaldo ConroyBender, Abby Daneele, MD  HPI/Subjective: Patient with shortness of breath. Patient with cough. No complaints of chest pain.  Objective: Filed Vitals:   01/08/16 1500 01/08/16 1600  BP: 138/60 139/71  Pulse: 81 82  Temp:    Resp: 17 17    Filed Weights   01/05/16 0914 01/05/16 1808 01/06/16 0630  Weight: 81.647 kg (180 lb) 94.1 kg (207 lb 7.3 oz) 95.1 kg (209 lb 10.5 oz)    ROS: Review of Systems  Constitutional: Negative for fever and chills.  Eyes: Negative for blurred vision.  Respiratory: Positive for cough and shortness of breath.   Cardiovascular: Negative for chest pain.  Gastrointestinal: Negative for nausea, vomiting, abdominal pain, diarrhea and constipation.  Genitourinary: Negative for dysuria.  Musculoskeletal: Negative for joint pain.  Neurological: Negative for dizziness and headaches.   Exam: Physical Exam  HENT:  Nose: No mucosal edema.  Mouth/Throat: No oropharyngeal exudate or posterior oropharyngeal edema.  Eyes: Conjunctivae, EOM and lids are normal. Pupils are equal, round, and reactive to light.  Neck: No JVD present. Carotid bruit is not present. No edema present. No thyroid mass and no thyromegaly present.  Cardiovascular: S1 normal and S2 normal.  Exam reveals no gallop.   No murmur heard. Pulses:      Dorsalis pedis pulses are 2+ on the right side, and 2+ on the left side.  Respiratory: No respiratory distress. He has no wheezes. He has no rhonchi. He has rales in the right middle field, the right lower field, the left middle field and the left lower field.  GI: Soft. Bowel sounds are normal. There is no tenderness.  Musculoskeletal:       Right ankle: He exhibits swelling.       Left ankle: He exhibits swelling.  Lymphadenopathy:    He has no cervical adenopathy.   Neurological: He is alert. No cranial nerve deficit.  Skin: Skin is warm. No rash noted. Nails show no clubbing.  Psychiatric: He has a normal mood and affect.      Data Reviewed: Basic Metabolic Panel:  Recent Labs Lab 01/05/16 0920 01/05/16 1650 01/05/16 1957 01/06/16 0443 01/06/16 1036 01/07/16 0431 01/08/16 0411  NA 139 136  --  137 137 138 136  K 2.9* 4.7  --  3.5 3.8 3.8 3.6  CL 111 109  --  108 108 108 104  CO2 20* 20*  --  20* 22 21* 24  GLUCOSE 181* 155*  --  122* 138* 111* 108*  BUN 21* 24*  --  30* 32* 42* 48*  CREATININE 1.47* 1.87*  --  1.75* 2.05* 2.33* 2.47*  CALCIUM 8.2* 8.2*  --  8.8* 8.8* 8.8* 8.3*  MG 1.9  --  1.9  --  2.2 2.1  --   PHOS  --   --   --   --  3.2 3.3  --    Liver Function Tests:  Recent Labs Lab 01/05/16 0920 01/06/16 1036  AST 24 35  ALT 22 38  ALKPHOS 71 83  BILITOT 0.8 0.5  PROT 6.6 6.9  ALBUMIN 3.0* 3.1*   CBC:  Recent Labs Lab 01/05/16 0920 01/06/16 0443 01/06/16 1036 01/07/16 0431 01/08/16 0411  WBC 7.1 11.8* 11.7* 10.7* 9.7  NEUTROABS  --   --  9.7*  --   --  HGB 11.4* 12.2* 12.0* 11.9* 10.6*  HCT 35.4* 37.6* 36.5* 36.9* 31.9*  MCV 87.5 86.5 85.3 86.8 87.8  PLT 149* 153 154 135* 120*   Cardiac Enzymes:  Recent Labs Lab 01/05/16 0920 01/05/16 1650 01/05/16 2327  TROPONINI 0.22* 2.16* 3.23*   BNP (last 3 results)  Recent Labs  01/05/16 0920  BNP 1339.0*    CBG:  Recent Labs Lab 01/05/16 1808  GLUCAP 144*    Recent Results (from the past 240 hour(s))  MRSA PCR Screening     Status: None   Collection Time: 01/05/16  6:23 PM  Result Value Ref Range Status   MRSA by PCR NEGATIVE NEGATIVE Final    Comment:        The GeneXpert MRSA Assay (FDA approved for NASAL specimens only), is one component of a comprehensive MRSA colonization surveillance program. It is not intended to diagnose MRSA infection nor to guide or monitor treatment for MRSA infections.      Studies: Dg Chest Port  1 View  01/07/2016  CLINICAL DATA:  Evaluation of respiratory distress. EXAM: PORTABLE CHEST 1 VIEW COMPARISON:  01/06/2016 FINDINGS: There is moderate cardiac enlargement. Bilateral pleural effusions are noted right greater than left. There is diffuse pulmonary edema identified. IMPRESSION: 1. CHF.  Unchanged from previous exam. Electronically Signed   By: Signa Kell M.D.   On: 01/07/2016 09:15    Scheduled Meds: . antiseptic oral rinse  7 mL Mouth Rinse BID  . aspirin EC  325 mg Oral Daily  . atorvastatin  40 mg Oral q1800  . doxazosin  2 mg Oral Daily  . heparin subcutaneous  5,000 Units Subcutaneous Q8H  . ipratropium  0.5 mg Nebulization Q8H   And  . levalbuterol  1.25 mg Nebulization Q8H  . loratadine  10 mg Oral Daily  . metoprolol succinate  12.5 mg Oral BID  . sodium chloride flush  3 mL Intravenous Q12H   Continuous Infusions: . amiodarone 30 mg/hr (01/08/16 0511)    Assessment/Plan:  1. Acute respiratory failure with hypoxia. Patient currently on 75% high flow nasal cannula. High risk of cardiopulmonary arrest. 2. Ventricular tachycardia. Patient on amiodarone drip as per cardiology. Patient will likely need defibrillator. 3. Acute systolic congestive heart failure. Unable to give Lasix at this time secondary to worsening creatinine. Continue metoprolol. 4. Acute myocardial infarction. Continue aspirin statin and beta blocker. Stop heparin drip secondary to gross hematuria 5. Gross hematuria. Stop heparin drip 6. BPH on doxazosin Zosyn 7. Hyperlipidemia unspecified on atorvastatin 8. Overall prognosis is poor. Confirm full CODE STATUS with wife on the phone. High risk for cardiopulmonary arrest.  Code Status:     Code Status Orders        Start     Ordered   01/05/16 1200  Full code   Continuous     01/05/16 1201    Code Status History    Date Active Date Inactive Code Status Order ID Comments User Context   This patient has a current code status but no  historical code status.     Family Communication: Spoke with wife on the phone Disposition Plan: To be determined  Consultants:  Cardiology  Critical care specialist  Nephrology  Time spent: 32 minutes critical care time  Alford Highland  [employer]

## 2016-01-08 NOTE — Progress Notes (Signed)
PULMONARY / CRITICAL CARE MEDICINE   Name: Jermaine Fields MRN: [id number] DOB: 12/20/1929    ADMISSION DATE:  01/05/2016  BRIEF HISTORY: 82 year old past medical history of hypertension, BPH, presented with near syncopal episode at home as well as chest pain. Arrival to the emergency room noted to be in ventricular tachycardia with rate in 180s, he was given adenosine and metoprolol without effect, he was then electrically cardioverted back into sinus rhythm, noted to have hypoxia in the 80s, placed on nonrebreather mask and brought to ICU for close observation and stabilizing of respiratory status before cardiac catheterization.  SUBJECTIVE:  No acute events overnight, did not require BiPAP, tolerating high flow nasal cannula well, this morning noted to be on FiO2 100%, saturating greater than 94%. No complaints of worsening chest pain or wheezing.  VITAL SIGNS: Temp:  [98.2 F (36.8 C)-99.4 F (37.4 C)] 98.7 F (37.1 C) (04/19 0700) Pulse Rate:  [37-104] 86 (04/19 1300) Resp:  [13-28] 20 (04/19 1300) BP: (107-159)/(55-96) 143/70 mmHg (04/19 1300) SpO2:  [87 %-98 %] 92 % (04/19 1328) FiO2 (%):  [70 %-100 %] 70 % (04/19 1328) HEMODYNAMICS:   VENTILATOR SETTINGS: Vent Mode:  [-]  FiO2 (%):  [70 %-100 %] 70 % INTAKE / OUTPUT:  Intake/Output Summary (Last 24 hours) at 01/08/16 1405 Last data filed at 01/08/16 0600  Gross per 24 hour  Intake 713.04 ml  Output    495 ml  Net 218.04 ml    Review of Systems  Constitutional: Positive for malaise/fatigue. Negative for chills.  Eyes: Negative for blurred vision.  Respiratory: Positive for shortness of breath. Negative for cough and wheezing.   Cardiovascular: Negative for chest pain and palpitations.  Gastrointestinal: Negative for heartburn and nausea.  Genitourinary: Positive for hematuria. Negative for dysuria.  Musculoskeletal: Negative for myalgias.  Neurological: Positive for weakness. Negative for headaches.   Endo/Heme/Allergies: Does not bruise/bleed easily.    Physical Exam  Constitutional: He is oriented to person, place, and time and well-developed, well-nourished, and in no distress.  HENT:  Head: Normocephalic and atraumatic.  Right Ear: External ear normal.  Left Ear: External ear normal.  Eyes: Pupils are equal, round, and reactive to light.  Neck: Neck supple.  Cardiovascular: Normal heart sounds and intact distal pulses.   Pulmonary/Chest: Effort normal. No respiratory distress. He has no wheezes.  Abdominal: Soft. Bowel sounds are normal. He exhibits no distension.  Musculoskeletal: Normal range of motion.  Neurological: He is alert and oriented to person, place, and time.  Skin: Skin is warm and dry.  Nursing note and vitals reviewed.    LABS:  CBC  Recent Labs Lab 01/06/16 1036 01/07/16 0431 01/08/16 0411  WBC 11.7* 10.7* 9.7  HGB 12.0* 11.9* 10.6*  HCT 36.5* 36.9* 31.9*  PLT 154 135* 120*   Coag's  Recent Labs Lab 01/05/16 0920 01/06/16 1036  APTT 28 30  INR 1.21 1.10   BMET  Recent Labs Lab 01/06/16 1036 01/07/16 0431 01/08/16 0411  NA 137 138 136  K 3.8 3.8 3.6  CL 108 108 104  CO2 22 21* 24  BUN 32* 42* 48*  CREATININE 2.05* 2.33* 2.47*  GLUCOSE 138* 111* 108*   Electrolytes  Recent Labs Lab 01/05/16 1957  01/06/16 1036 01/07/16 0431 01/08/16 0411  CALCIUM  --   < > 8.8* 8.8* 8.3*  MG 1.9  --  2.2 2.1  --   PHOS  --   --  3.2 3.3  --   < > =  values in this interval not displayed. Sepsis Markers No results for input(s): LATICACIDVEN, PROCALCITON, O2SATVEN in the last 168 hours. ABG  Recent Labs Lab 01/05/16 0950  PHART 7.34*  PCO2ART 38  PO2ART 61*   Liver Enzymes  Recent Labs Lab 01/05/16 0920 01/06/16 1036  AST 24 35  ALT 22 38  ALKPHOS 71 83  BILITOT 0.8 0.5  ALBUMIN 3.0* 3.1*   Cardiac Enzymes  Recent Labs Lab 01/05/16 0920 01/05/16 1650 01/05/16 2327  TROPONINI 0.22* 2.16* 3.23*    Glucose  Recent Labs Lab 01/05/16 1808  GLUCAP 144*    Imaging No results found.  STUDIES:  Echo 04/17>>EF 20-25%, mild LA dilation, moderate RV dilation, 4 chamber dilation with severe LV dysfunction  CULTURES: MRSA screen negative  ANTIBIOTICS: None  SIGNIFICANT EVENTS: 04/16>ED with dyspnea, near-syncope>SVT>Cardioverted  LINES/TUBES: PIVs  DISCUSSION: 82 YO AA male with acute hypoxic respiratory failure 2/2 volume overload, VT s/p cardioversion now in NSR, chest pain, near syncope and elevated troponin likely acute CHF exacerbation, no cath today due to inability to lay flat   ASSESSMENT / PLAN:  PULMONARY A: Acute respiratory failure - slowly improving Pulmonary edema P:  - on HFNC, Fio2 weaned down to 70%, continue weaning as tolerated.  -IV duiresis (gentle)- on hold given inc in Cr  -high risk for intubation/cardiac arrest -overall improvement today in resp status, however, cannot lay flat.  -would recommend palliative and DNR status, given overall health and clinical status.  -high risk intubation for cardiac arrest, if needed for elective procedures.   CARDIOVASCULAR A:  VT s/p cardioversion Hypertension Elevated troponin Near-syncope P:  -Cardiology following -Amio and heparin gttes per cardiology -possible cardiac cath tomorrow if able to lay flat -Cycle cardiac enzymes -prn hydralazine for SBP>170  RENAL A:  AKI - worsening Hypomagnesemia Hypokalemia P:  -Monitor creatinine -avoid nephro toxic drugs.  -Monitor and replace electrolytes  HEMATOLOGIC A:  Anemia Thrombocytopenia P:  -Monitor CBC  ENDOCRINE A:  Mild hyperglycemia without a diagnosis of DM P:  -Monitor blood glucose with BMP  NEUROLOGIC A:  Acute encephalopathy-resolving P:  RASS goal: n/a -Monitor mental status   Thank you for consulting [facility], Please feel free to contacts [place] with any questions at  [phone] (please enter 7-digits).  I have personally obtained a history, examined the patient, evaluated laboratory and imaging results, formulated the assessment and plan and placed orders.  Pulmonary Care Time devoted to patient care services described in this note is 40 minutes.    Stephanie Acre, MD [facility] Pager [phone] (please enter 7-digits) On Call Pager - [phone] (please enter 7-digits)  Note: This note was prepared with Dragon dictation along with smaller phrase technology. Any transcriptional errors that result from this process are unintentional.

## 2016-01-08 NOTE — Progress Notes (Signed)
ANTICOAGULATION CONSULT NOTE - Follow Up Consult  Pharmacy Consult for Heparin  Indication: chest pain/ACS  Allergies  Allergen Reactions  . Vitamin D2 [Ergocalciferol] Other (See Comments)    This medication made the patient too hot.    Patient Measurements: Height:  (180.3 cm) Weight: 209 lb 10.5 oz (95.1 kg) IBW/kg (Calculated) : 75.3 Heparin Dosing Weight: 81.6 kg   Vital Signs: Temp: 99.4 F (37.4 C) (04/18 2027) Temp Source: Oral (04/18 2027) BP: 127/68 mmHg (04/19 0000) Pulse Rate: 89 (04/19 0000)  Labs:  Recent Labs  01/05/16 0920 01/05/16 1650  01/05/16 2327 01/06/16 0443 01/06/16 1036  01/07/16 0431 01/07/16 1314 01/07/16 2330  HGB 11.4*  --   --   --  12.2* 12.0*  --  11.9*  --   --   HCT 35.4*  --   --   --  37.6* 36.5*  --  36.9*  --   --   PLT 149*  --   --   --  153 154  --  135*  --   --   APTT 28  --   --   --   --  30  --   --   --   --   LABPROT 15.5*  --   --   --   --  14.4  --   --   --   --   INR 1.21  --   --   --   --  1.10  --   --   --   --   HEPARINUNFRC  --   --   < >  --  0.45  --   < > 0.33 0.25* 0.35  CREATININE 1.47* 1.87*  --   --  1.75* 2.05*  --  2.33*  --   --   TROPONINI 0.22* 2.16*  --  3.23*  --   --   --   --   --   --   < > = values in this interval not displayed.  Estimated Creatinine Clearance: 27.8 mL/min (by C-G formula based on Cr of 2.33).   Medical History: Past Medical History  Diagnosis Date  . Hypertension   . BPH (benign prostatic hyperplasia)   . Environmental allergies     Medications:  Prescriptions prior to admission  Medication Sig Dispense Refill Last Dose  . amLODipine (NORVASC) 10 MG tablet Take 10 mg by mouth daily.   01/04/2016 at Unknown time  . aspirin EC 81 MG tablet Take 81 mg by mouth daily.   01/04/2016 at Unknown time  . cetirizine (ZYRTEC) 10 MG tablet Take 10 mg by mouth daily.   01/04/2016 at Unknown time  . cloNIDine (CATAPRES) 0.1 MG tablet Take 0.1 mg by mouth 2 (two) times  daily.   01/04/2016 at Unknown time  . doxazosin (CARDURA) 2 MG tablet Take 2 mg by mouth daily.   01/04/2016 at Unknown time  . fluticasone (FLONASE) 50 MCG/ACT nasal spray Place 2 sprays into both nostrils daily as needed. For sinus congestion.   01/04/2016 at Unknown time  . lisinopril (PRINIVIL,ZESTRIL) 40 MG tablet Take 40 mg by mouth daily.   01/04/2016 at Unknown time    Assessment: Pharmacy consulted to dose heparin in this 82 year old male admitted with elevated cardiac enzymes.   Goal of Therapy:  Heparin level 0.3-0.7 units/ml Monitor platelets by anticoagulation protocol: Yes HL= 0.25  Plan:  Will bolus heparin 1400 units iv  once and increase infusion to 1350 units/hr. Will recheck a HL in 8 hours.   4/18 PM heparin level 0.35. Continue current regimen. Recheck in 8 hours to confirm.   Araceli Arango S, RPh Clinical Pharmacist 01/08/2016,12:19 AM

## 2016-01-09 LAB — BASIC METABOLIC PANEL
ANION GAP: 8 (ref 5–15)
BUN: 60 mg/dL — ABNORMAL HIGH (ref 6–20)
CALCIUM: 8.7 mg/dL — AB (ref 8.9–10.3)
CO2: 25 mmol/L (ref 22–32)
Chloride: 106 mmol/L (ref 101–111)
Creatinine, Ser: 2.49 mg/dL — ABNORMAL HIGH (ref 0.61–1.24)
GFR calc non Af Amer: 22 mL/min — ABNORMAL LOW (ref >60)
GFR, EST AFRICAN AMERICAN: 26 mL/min — AB (ref >60)
GLUCOSE: 108 mg/dL — AB (ref 65–99)
POTASSIUM: 3.6 mmol/L (ref 3.5–5.1)
Sodium: 139 mmol/L (ref 135–145)

## 2016-01-09 LAB — CBC
HEMATOCRIT: 32.6 % — AB (ref 40.0–52.0)
HEMOGLOBIN: 10.6 g/dL — AB (ref 13.0–18.0)
MCH: 28.3 pg (ref 26.0–34.0)
MCHC: 32.6 g/dL (ref 32.0–36.0)
MCV: 86.6 fL (ref 80.0–100.0)
Platelets: 119 10*3/uL — ABNORMAL LOW (ref 150–440)
RBC: 3.76 MIL/uL — AB (ref 4.40–5.90)
RDW: 16.7 % — AB (ref 11.5–14.5)
WBC: 6.9 10*3/uL (ref 3.8–10.6)

## 2016-01-09 MED ORDER — PREDNISONE 20 MG PO TABS
30.0000 mg | ORAL_TABLET | Freq: Every day | ORAL | Status: AC
  Administered 2016-01-10 – 2016-01-13 (×4): 30 mg via ORAL
  Filled 2016-01-09 (×5): qty 1

## 2016-01-09 MED ORDER — AMIODARONE HCL 200 MG PO TABS
200.0000 mg | ORAL_TABLET | Freq: Two times a day (BID) | ORAL | Status: DC
  Administered 2016-01-09: 200 mg via ORAL
  Filled 2016-01-09: qty 1

## 2016-01-09 NOTE — Progress Notes (Signed)
Spoke with Dr Dema SeverinMungal, patients IV intact with blood return. Upon assessment I am noticing a small hard spot developing above insertion site and I asked if we might need a PICC line if we plan to continue the amiodarone drip. At this point he is going to speak with Cardiology regarding transitioning him to PO.

## 2016-01-09 NOTE — Progress Notes (Signed)
[facility]  SUBJECTIVE: Mild shortness of breath but appears alert and oriented today. Gets short of breath when laying flat as well as twisting from side to side. Denies chest pain or shortness of breath sitting at a 45 angle in bed. Is currently hemodynamically stable. He is -450 cc over the past 24 hours. Also oximetry on high flow oxygen is 96 %.   Filed Vitals:   01/09/16 1028 01/09/16 1100 01/09/16 1200 01/09/16 1250  BP:  146/129 133/63   Pulse: 90 79 85   Temp:      TempSrc:      Resp:  20 18   Height:      Weight:      SpO2:  95% 92% 96%    Intake/Output Summary (Last 24 hours) at 01/09/16 1408 Last data filed at 01/08/16 1500  Gross per 24 hour  Intake      0 ml  Output    450 ml  Net   -450 ml    LABS: Basic Metabolic Panel:  Recent Labs  16/10/96 0431 01/08/16 0411 01/09/16 0444  NA 138 136 139  K 3.8 3.6 3.6  CL 108 104 106  CO2 21* 24 25  GLUCOSE 111* 108* 108*  BUN 42* 48* 60*  CREATININE 2.33* 2.47* 2.49*  CALCIUM 8.8* 8.3* 8.7*  MG 2.1  --   --   PHOS 3.3  --   --    Liver Function Tests: No results for input(s): AST, ALT, ALKPHOS, BILITOT, PROT, ALBUMIN in the last 72 hours. No results for input(s): LIPASE, AMYLASE in the last 72 hours. CBC:  Recent Labs  01/08/16 0411 01/09/16 0444  WBC 9.7 6.9  HGB 10.6* 10.6*  HCT 31.9* 32.6*  MCV 87.8 86.6  PLT 120* 119*   Cardiac Enzymes: No results for input(s): CKTOTAL, CKMB, CKMBINDEX, TROPONINI in the last 72 hours. BNP: Invalid input(s): POCBNP D-Dimer: No results for input(s): DDIMER in the last 72 hours. Hemoglobin A1C: No results for input(s): HGBA1C in the last 72 hours. Fasting Lipid Panel:  Recent Labs  01/07/16 0431  CHOL 156  HDL 66  LDLCALC 77  TRIG 66  CHOLHDL 2.4   Thyroid Function Tests: No results for input(s): TSH, T4TOTAL, T3FREE, THYROIDAB in the last 72 hours.  Invalid input(s): FREET3 Anemia Panel: No results for  input(s): VITAMINB12, FOLATE, FERRITIN, TIBC, IRON, RETICCTPCT in the last 72 hours.   Physical Exam: Blood pressure 133/63, pulse 85, temperature 99 F (37.2 C), temperature source Oral, resp. rate 18, height  (1.803 m), weight 95.1 kg (209 lb 10.5 oz), SpO2 96 %.   Wt Readings from Last 1 Encounters:  01/06/16 95.1 kg (209 lb 10.5 oz)     General appearance: cooperative Resp: diminished breath sounds bilaterally Cardio: regularly irregular rhythm GI: soft, non-tender; bowel sounds normal; no masses,  no organomegaly Extremities: edema 2+ edema Neurologic: Grossly normal  TELEMETRY: Reviewed telemetry pt in frequent PVCs no sustained ventricular arrhythmia:  ASSESSMENT AND PLAN:  Principal Problem:   Ventricular tachycardia (HCC)-had history of difficult to control ventricular tachycardia. He is currently on an amiodarone drip as well as beta blockers. He has been seen by  electrophysiology for consideration of AICD. He has been felt by the acute care team to be too unstable for transfer. At this point would continue with beta blocker and amiodarone. Continue to attempt to wean his oxygen flow. We'll continue with careful diuresis although renal function precludes aggressive  diuresis. Active Problems:   Acute respiratory failure (HCC)   Acute systolic CHF (congestive heart failure) (HCC)   NSTEMI (non-ST elevated myocardial infarction) (HCC)    Dalia HeadingFATH,Raychell Holcomb A., MD, [facility] HospitalFACC 01/09/2016 2:08 PM

## 2016-01-09 NOTE — Progress Notes (Addendum)
PULMONARY / CRITICAL CARE MEDICINE   Name: Jermaine Fields MRN: [id number] DOB: 04/19/1930    ADMISSION DATE:  01/05/2016  BRIEF HISTORY: 82 year old past medical history of hypertension, BPH, presented with near syncopal episode at home as well as chest pain. Arrival to the emergency room noted to be in ventricular tachycardia with rate in 180s, he was given adenosine and metoprolol without effect, he was then electrically cardioverted back into sinus rhythm, noted to have hypoxia in the 80s, placed on nonrebreather mask and brought to ICU for close observation and stabilizing of respiratory status before cardiac catheterization.  SUBJECTIVE:  No acute events overnight, tolerating high flow nasal cannula well, this morning noted to be on FiO2 65%, saturating greater than 94%. No complaints of worsening chest pain or wheezing.  VITAL SIGNS: Temp:  [98.8 F (37.1 C)-99 F (37.2 C)] 99 F (37.2 C) (04/20 0700) Pulse Rate:  [30-90] 85 (04/20 1500) Resp:  [14-22] 16 (04/20 1500) BP: (126-159)/(49-129) 128/66 mmHg (04/20 1500) SpO2:  [89 %-100 %] 93 % (04/20 1500) FiO2 (%):  [65 %] 65 % (04/20 1250) HEMODYNAMICS:   VENTILATOR SETTINGS: Vent Mode:  [-]  FiO2 (%):  [65 %] 65 % INTAKE / OUTPUT: No intake or output data in the 24 hours ending 01/09/16 1515  Review of Systems  Constitutional: Positive for malaise/fatigue. Negative for chills.  Eyes: Negative for blurred vision.  Respiratory: Positive for shortness of breath. Negative for cough and wheezing.   Cardiovascular: Negative for chest pain and palpitations.  Gastrointestinal: Negative for heartburn and nausea.  Genitourinary: Positive for hematuria. Negative for dysuria.  Musculoskeletal: Negative for myalgias.  Neurological: Positive for weakness. Negative for headaches.  Endo/Heme/Allergies: Does not bruise/bleed easily.    Physical Exam  Constitutional: He is oriented to person, place, and time and well-developed,  well-nourished, and in no distress.  HENT:  Head: Normocephalic and atraumatic.  Right Ear: External ear normal.  Left Ear: External ear normal.  Eyes: Pupils are equal, round, and reactive to light.  Neck: Neck supple.  Cardiovascular: Normal heart sounds and intact distal pulses.   Pulmonary/Chest: Effort normal. No respiratory distress. He has no wheezes.  Abdominal: Soft. Bowel sounds are normal. He exhibits no distension.  Musculoskeletal: Normal range of motion.  Neurological: He is alert and oriented to person, place, and time.  Skin: Skin is warm and dry.  Nursing note and vitals reviewed.    LABS:  CBC  Recent Labs Lab 01/07/16 0431 01/08/16 0411 01/09/16 0444  WBC 10.7* 9.7 6.9  HGB 11.9* 10.6* 10.6*  HCT 36.9* 31.9* 32.6*  PLT 135* 120* 119*   Coag's  Recent Labs Lab 01/05/16 0920 01/06/16 1036  APTT 28 30  INR 1.21 1.10   BMET  Recent Labs Lab 01/07/16 0431 01/08/16 0411 01/09/16 0444  NA 138 136 139  K 3.8 3.6 3.6  CL 108 104 106  CO2 21* 24 25  BUN 42* 48* 60*  CREATININE 2.33* 2.47* 2.49*  GLUCOSE 111* 108* 108*   Electrolytes  Recent Labs Lab 01/05/16 1957  01/06/16 1036 01/07/16 0431 01/08/16 0411 01/09/16 0444  CALCIUM  --   < > 8.8* 8.8* 8.3* 8.7*  MG 1.9  --  2.2 2.1  --   --   PHOS  --   --  3.2 3.3  --   --   < > = values in this interval not displayed. Sepsis Markers No results for input(s): LATICACIDVEN, PROCALCITON, O2SATVEN in the last 168 hours.  ABG  Recent Labs Lab 01/05/16 0950  PHART 7.34*  PCO2ART 38  PO2ART 61*   Liver Enzymes  Recent Labs Lab 01/05/16 0920 01/06/16 1036  AST 24 35  ALT 22 38  ALKPHOS 71 83  BILITOT 0.8 0.5  ALBUMIN 3.0* 3.1*   Cardiac Enzymes  Recent Labs Lab 01/05/16 0920 01/05/16 1650 01/05/16 2327  TROPONINI 0.22* 2.16* 3.23*   Glucose  Recent Labs Lab 01/05/16 1808  GLUCAP 144*    Imaging No results found.  STUDIES:  Echo 04/17>>EF 20-25%, mild LA  dilation, moderate RV dilation, 4 chamber dilation with severe LV dysfunction  CULTURES: MRSA screen negative  ANTIBIOTICS: None  SIGNIFICANT EVENTS: 04/16>ED with dyspnea, near-syncope>SVT>Cardioverted  LINES/TUBES: PIVs  DISCUSSION: 82 YO AA male with acute hypoxic respiratory failure 2/2 volume overload, VT s/p cardioversion now in NSR, chest pain, near syncope and elevated troponin likely acute CHF exacerbation  ASSESSMENT / PLAN:  PULMONARY A: Acute respiratory failure - slowly improving Pulmonary edema ?Pneuomonitis (mild) P:  - on HFNC, Fio2 weaned down to 65%, continue weaning as tolerated.  -IV duiresis (gentle)- on hold given inc in Cr  -high risk for intubation/cardiac arrest -overall improvement today in resp status, however, cannot lay flat.  -would recommend palliative, given overall health and clinical status. Now DNR -high risk intubation for cardiac arrest, if needed for elective procedures.  -Prednisone  x 5 days.   CARDIOVASCULAR A:  VT s/p cardioversion Hypertension Elevated troponin Near-syncope P:  -Cardiology following -Amio and heparin gtt's per cardiology - d\c amio gtt today, and switch to PO (200 BID) -possible cardiac cath tomorrow if able to lay flat -Cycle cardiac enzymes -prn hydralazine for SBP>170  RENAL A:  AKI - worsening Hypomagnesemia Hypokalemia P:  -Monitor creatinine -avoid nephro toxic drugs.  -Monitor and replace electrolytes  HEMATOLOGIC A:  Anemia Thrombocytopenia P:  -Monitor CBC  ENDOCRINE A:  Mild hyperglycemia without a diagnosis of DM P:  -Monitor blood glucose with BMP  NEUROLOGIC A:  Acute encephalopathy-resolving P:  RASS goal: n/a -Monitor mental status   Thank you for consulting [facility], Please feel free to contacts [place] with any questions at [phone] (please enter 7-digits).  I have personally obtained a history, examined the  patient, evaluated laboratory and imaging results, formulated the assessment and plan and placed orders.  Pulmonary Care Time devoted to patient care services described in this note is 40 minutes.    Stephanie Acre, MD [facility] Pager [phone] (please enter 7-digits) On Call Pager - [phone] (please enter 7-digits)  Note: This note was prepared with Dragon dictation along with smaller phrase technology. Any transcriptional errors that result from this process are unintentional.

## 2016-01-09 NOTE — Progress Notes (Signed)
Patient ID: Jermaine Fields, male   DOB: 03/19/1930, 82 y.o.   MRN: [id number]  Advanced care planning (ACP)  Diagnosis: 1. Acute respiratory failure with hypoxia. 2. Ventricular tachycardia. 3. Acute systolic congestive heart failure. 4. Acute NSTEMI. 5. Acute renal failure.  Long discussion with patient, wife and daughters at the bedside.  Time spent 17 minutes face-to-face with family and patient  CODE STATUS addressed. Patient wishes to be a DO NOT RESUSCITATE. CODE STATUS changed as an order.  I talked about numerous life-threatening diagnoses and that we have to wait for his respiratory status to get better before we can do anything with regards to his heart. His kidney function must get better before we can do IV Lasix to help out with the respiratory status. Overall prognosis is poor and patient is a high risk for cardiopulmonary arrest.  Patient is not quite sure how aggressive he wants to be with further testing. Daughters upset about his DO NOT RESUSCITATE decision.  Palliative care consultation ordered.  This is been dictated by Dr. Alford Highlandichard Aradhana Gin

## 2016-01-09 NOTE — Progress Notes (Signed)
Patient ID: Jermaine Fields, male   DOB: 01-Apr-1930, 82 y.o.   MRN: [id number] [facility] PROGRESS NOTE  Jermaine Fields WNU:[id number] DOB: 07/30/1930 DOA: 01/05/2016 PCP: Oswaldo Conroy, MD  HPI/Subjective: Patient has some shortness of breath and some cough. No complaints of chest pain.  Objective: Filed Vitals:   01/09/16 1100 01/09/16 1200  BP: 146/129 133/63  Pulse: 79 85  Temp:    Resp: 20 18    Filed Weights   01/05/16 0914 01/05/16 1808 01/06/16 0630  Weight: 81.647 kg (180 lb) 94.1 kg (207 lb 7.3 oz) 95.1 kg (209 lb 10.5 oz)    ROS: Review of Systems  Constitutional: Negative for fever and chills.  Eyes: Negative for blurred vision.  Respiratory: Positive for cough and shortness of breath.   Cardiovascular: Negative for chest pain.  Gastrointestinal: Negative for nausea, vomiting, abdominal pain, diarrhea and constipation.  Genitourinary: Negative for dysuria.  Musculoskeletal: Negative for joint pain.  Neurological: Negative for dizziness and headaches.   Exam: Physical Exam  HENT:  Nose: No mucosal edema.  Mouth/Throat: No oropharyngeal exudate or posterior oropharyngeal edema.  Eyes: Conjunctivae, EOM and lids are normal. Pupils are equal, round, and reactive to light.  Neck: No JVD present. Carotid bruit is not present. No edema present. No thyroid mass and no thyromegaly present.  Cardiovascular: S1 normal and S2 normal.  Exam reveals no gallop.   No murmur heard. Pulses:      Dorsalis pedis pulses are 2+ on the right side, and 2+ on the left side.  Respiratory: No respiratory distress. He has no wheezes. He has no rhonchi. He has rales in the right middle field, the right lower field, the left middle field and the left lower field.  GI: Soft. Bowel sounds are normal. There is no tenderness.  Musculoskeletal:       Right ankle: He exhibits swelling.       Left ankle: He exhibits swelling.  Lymphadenopathy:    He has no cervical adenopathy.   Neurological: He is alert. No cranial nerve deficit.  Skin: Skin is warm. No rash noted. Nails show no clubbing.  Psychiatric: He has a normal mood and affect.      Data Reviewed: Basic Metabolic Panel:  Recent Labs Lab 01/05/16 0920  01/05/16 1957 01/06/16 0443 01/06/16 1036 01/07/16 0431 01/08/16 0411 01/09/16 0444  NA 139  < >  --  137 137 138 136 139  K 2.9*  < >  --  3.5 3.8 3.8 3.6 3.6  CL 111  < >  --  108 108 108 104 106  CO2 20*  < >  --  20* 22 21* 24 25  GLUCOSE 181*  < >  --  122* 138* 111* 108* 108*  BUN 21*  < >  --  30* 32* 42* 48* 60*  CREATININE 1.47*  < >  --  1.75* 2.05* 2.33* 2.47* 2.49*  CALCIUM 8.2*  < >  --  8.8* 8.8* 8.8* 8.3* 8.7*  MG 1.9  --  1.9  --  2.2 2.1  --   --   PHOS  --   --   --   --  3.2 3.3  --   --   < > = values in this interval not displayed. Liver Function Tests:  Recent Labs Lab 01/05/16 0920 01/06/16 1036  AST 24 35  ALT 22 38  ALKPHOS 71 83  BILITOT 0.8 0.5  PROT 6.6 6.9  ALBUMIN 3.0*  3.1*   CBC:  Recent Labs Lab 01/06/16 0443 01/06/16 1036 01/07/16 0431 01/08/16 0411 01/09/16 0444  WBC 11.8* 11.7* 10.7* 9.7 6.9  NEUTROABS  --  9.7*  --   --   --   HGB 12.2* 12.0* 11.9* 10.6* 10.6*  HCT 37.6* 36.5* 36.9* 31.9* 32.6*  MCV 86.5 85.3 86.8 87.8 86.6  PLT 153 154 135* 120* 119*   Cardiac Enzymes:  Recent Labs Lab 01/05/16 0920 01/05/16 1650 01/05/16 2327  TROPONINI 0.22* 2.16* 3.23*   BNP (last 3 results)  Recent Labs  01/05/16 0920  BNP 1339.0*    CBG:  Recent Labs Lab 01/05/16 1808  GLUCAP 144*    Recent Results (from the past 240 hour(s))  MRSA PCR Screening     Status: None   Collection Time: 01/05/16  6:23 PM  Result Value Ref Range Status   MRSA by PCR NEGATIVE NEGATIVE Final    Comment:        The GeneXpert MRSA Assay (FDA approved for NASAL specimens only), is one component of a comprehensive MRSA colonization surveillance program. It is not intended to diagnose  MRSA infection nor to guide or monitor treatment for MRSA infections.      Scheduled Meds: . antiseptic oral rinse  7 mL Mouth Rinse BID  . aspirin EC  325 mg Oral Daily  . atorvastatin  40 mg Oral q1800  . doxazosin  2 mg Oral Daily  . heparin subcutaneous  5,000 Units Subcutaneous Q8H  . loratadine  10 mg Oral Daily  . metoprolol succinate  12.5 mg Oral BID  . sodium chloride flush  3 mL Intravenous Q12H   Continuous Infusions: . amiodarone 30 mg/hr (01/09/16 0531)    Assessment/Plan:  1. Acute respiratory failure with hypoxia. Patient currently on 68 % high flow nasal cannula. High risk of cardiopulmonary arrest. 2. Ventricular tachycardia. Patient on amiodarone drip as per cardiology. Patient will likely need defibrillator. 3. Acute systolic congestive heart failure. Unable to give Lasix at this time secondary to worsening creatinine. Continue metoprolol. 4. Acute myocardial infarction. Continue aspirin statin and beta blocker. Stop heparin drip secondary to gross hematuria 5. Gross hematuria. Improved off heparin drip 6. BPH on doxazosin 7. Hyperlipidemia unspecified on atorvastatin 8. I made the patient a DO NOT RESUSCITATE.   Code Status: Patient made a DO NOT RESUSCITATE on 01/09/2016.    Code Status Orders        Start     Ordered   01/09/16 1200    DNR   01/05/16 1201    Code Status History    Date Active Date Inactive Code Status Order ID Comments User Context   This patient has a current code status but no historical code status.     Family Communication: Spoke with wife and daughters at the bedside Disposition Plan: To be determined  Consultants:  Cardiology  Critical care specialist  Nephrology  Time spent: 35 minutes critical care time, face to face with patient and family  Alford HighlandWIETING, Simpson Paulos  [employer]

## 2016-01-09 NOTE — Progress Notes (Signed)
I went to assess patients IV-Amiodorone that was infusing. Hard spot above insertion site. Good blood return present from IV. Stopped infusing Amiodorone through that IV and removed it. Cold compressess applied. Patient states that it does not hurt.

## 2016-01-10 DIAGNOSIS — N289 Disorder of kidney and ureter, unspecified: Secondary | ICD-10-CM

## 2016-01-10 DIAGNOSIS — N189 Chronic kidney disease, unspecified: Secondary | ICD-10-CM

## 2016-01-10 LAB — BASIC METABOLIC PANEL
ANION GAP: 7 (ref 5–15)
BUN: 59 mg/dL — ABNORMAL HIGH (ref 6–20)
CALCIUM: 8.7 mg/dL — AB (ref 8.9–10.3)
CO2: 26 mmol/L (ref 22–32)
Chloride: 107 mmol/L (ref 101–111)
Creatinine, Ser: 2.24 mg/dL — ABNORMAL HIGH (ref 0.61–1.24)
GFR, EST AFRICAN AMERICAN: 29 mL/min — AB (ref >60)
GFR, EST NON AFRICAN AMERICAN: 25 mL/min — AB (ref >60)
GLUCOSE: 99 mg/dL (ref 65–99)
POTASSIUM: 3.8 mmol/L (ref 3.5–5.1)
Sodium: 140 mmol/L (ref 135–145)

## 2016-01-10 MED ORDER — AMIODARONE HCL 200 MG PO TABS
400.0000 mg | ORAL_TABLET | Freq: Two times a day (BID) | ORAL | Status: DC
  Administered 2016-01-10 – 2016-01-15 (×11): 400 mg via ORAL
  Filled 2016-01-10 (×11): qty 2

## 2016-01-10 MED ORDER — ASPIRIN EC 81 MG PO TBEC
81.0000 mg | DELAYED_RELEASE_TABLET | Freq: Every day | ORAL | Status: DC
  Administered 2016-01-10 – 2016-01-15 (×6): 81 mg via ORAL
  Filled 2016-01-10 (×6): qty 1

## 2016-01-10 MED ORDER — FUROSEMIDE 10 MG/ML IJ SOLN
8.0000 mg/h | INTRAVENOUS | Status: DC
  Administered 2016-01-10 – 2016-01-11 (×2): 8 mg/h via INTRAVENOUS
  Filled 2016-01-10 (×2): qty 25

## 2016-01-10 NOTE — Progress Notes (Signed)
PULMONARY / CRITICAL CARE MEDICINE   Name: Jermaine Fields MRN: [id number] DOB: 03/27/1930    ADMISSION DATE:  01/05/2016  BRIEF HISTORY: 82 year old past medical history of hypertension, BPH, presented with near syncopal episode at home as well as chest pain. Arrival to the emergency room noted to be in ventricular tachycardia with rate in 180s, he was given adenosine and metoprolol without effect, he was then electrically cardioverted back into sinus rhythm, noted to have hypoxia in the 80s, placed on nonrebreather mask and brought to ICU for close observation and stabilizing of respiratory status before cardiac catheterization.  SUBJECTIVE:  No acute events overnight, tolerating high flow nasal cannula well, this morning noted to be on FiO2 55%, saturating greater than 94%. No complaints of worsening chest pain or wheezing.  Made DNR yesterday.   VITAL SIGNS: Temp:  [98.2 F (36.8 C)-98.6 F (37 C)] 98.4 F (36.9 C) (04/20 1943) Pulse Rate:  [31-90] 82 (04/21 0600) Resp:  [15-22] 20 (04/21 0600) BP: (126-162)/(56-129) 131/68 mmHg (04/21 0600) SpO2:  [91 %-100 %] 91 % (04/21 0600) FiO2 (%):  [55 %-65 %] 55 % (04/21 0028) HEMODYNAMICS:   VENTILATOR SETTINGS: Vent Mode:  [-]  FiO2 (%):  [55 %-65 %] 55 % INTAKE / OUTPUT:  Intake/Output Summary (Last 24 hours) at 01/10/16 0746 Last data filed at 01/09/16 1700  Gross per 24 hour  Intake      0 ml  Output    300 ml  Net   -300 ml    Review of Systems  Constitutional: Positive for malaise/fatigue. Negative for chills.  Eyes: Negative for blurred vision.  Respiratory: Positive for shortness of breath. Negative for cough and wheezing.   Cardiovascular: Negative for chest pain and palpitations.  Gastrointestinal: Negative for heartburn and nausea.  Genitourinary: Positive for hematuria. Negative for dysuria.  Musculoskeletal: Negative for myalgias.  Neurological: Positive for weakness. Negative for headaches.  Endo/Heme/Allergies:  Does not bruise/bleed easily.    Physical Exam  Constitutional: He is oriented to person, place, and time and well-developed, well-nourished, and in no distress.  HENT:  Head: Normocephalic and atraumatic.  Right Ear: External ear normal.  Left Ear: External ear normal.  Eyes: Pupils are equal, round, and reactive to light.  Neck: Neck supple.  Cardiovascular: Normal heart sounds and intact distal pulses.   Pulmonary/Chest: Effort normal. No respiratory distress. He has no wheezes.  Abdominal: Soft. Bowel sounds are normal. He exhibits no distension.  Musculoskeletal: Normal range of motion.  Neurological: He is alert and oriented to person, place, and time.  Skin: Skin is warm and dry.  Nursing note and vitals reviewed.    LABS:  CBC  Recent Labs Lab 01/07/16 0431 01/08/16 0411 01/09/16 0444  WBC 10.7* 9.7 6.9  HGB 11.9* 10.6* 10.6*  HCT 36.9* 31.9* 32.6*  PLT 135* 120* 119*   Coag's  Recent Labs Lab 01/05/16 0920 01/06/16 1036  APTT 28 30  INR 1.21 1.10   BMET  Recent Labs Lab 01/07/16 0431 01/08/16 0411 01/09/16 0444  NA 138 136 139  K 3.8 3.6 3.6  CL 108 104 106  CO2 21* 24 25  BUN 42* 48* 60*  CREATININE 2.33* 2.47* 2.49*  GLUCOSE 111* 108* 108*   Electrolytes  Recent Labs Lab 01/05/16 1957  01/06/16 1036 01/07/16 0431 01/08/16 0411 01/09/16 0444  CALCIUM  --   < > 8.8* 8.8* 8.3* 8.7*  MG 1.9  --  2.2 2.1  --   --  PHOS  --   --  3.2 3.3  --   --   < > = values in this interval not displayed. Sepsis Markers No results for input(s): LATICACIDVEN, PROCALCITON, O2SATVEN in the last 168 hours. ABG  Recent Labs Lab 01/05/16 0950  PHART 7.34*  PCO2ART 38  PO2ART 61*   Liver Enzymes  Recent Labs Lab 01/05/16 0920 01/06/16 1036  AST 24 35  ALT 22 38  ALKPHOS 71 83  BILITOT 0.8 0.5  ALBUMIN 3.0* 3.1*   Cardiac Enzymes  Recent Labs Lab 01/05/16 0920 01/05/16 1650 01/05/16 2327  TROPONINI 0.22* 2.16* 3.23*    Glucose  Recent Labs Lab 01/05/16 1808  GLUCAP 144*    Imaging No results found.  STUDIES:  Echo 04/17>>EF 20-25%, mild LA dilation, moderate RV dilation, 4 chamber dilation with severe LV dysfunction  CULTURES: MRSA screen negative  ANTIBIOTICS: None  SIGNIFICANT EVENTS: 04/16>ED with dyspnea, near-syncope>SVT>Cardioverted  LINES/TUBES: PIVs  DISCUSSION: 82 YO AA male with acute hypoxic respiratory failure 2/2 volume overload, VT s/p cardioversion now in NSR, chest pain, near syncope and elevated troponin likely acute CHF exacerbation  ASSESSMENT / PLAN:  PULMONARY A: Acute respiratory failure - slowly improving Pulmonary edema ?Pneuomonitis (mild) P:  - on HFNC, Fio2 weaned down to 55%, continue weaning as tolerated.  -IV duiresis (gentle)- on hold given inc in Cr  -high risk for intubation/cardiac arrest -overall improvement today in resp status, however, cannot lay flat.  -would recommend palliative, given overall health and clinical status. Now DNR -high risk intubation for cardiac arrest, if needed for elective procedures.  -Prednisone  x 5 days.   CARDIOVASCULAR A:  VT s/p cardioversion Hypertension Elevated troponin Near-syncope P:  -Cardiology following -Amio and heparin gtt's per cardiology - d\c amio gtt today, and switch to PO (200 BID) -possible cardiac cath tomorrow if able to lay flat -Cycle cardiac enzymes -prn hydralazine for SBP>170  RENAL A:  AKI - worsening Hypomagnesemia Hypokalemia P:  -Monitor creatinine -avoid nephro toxic drugs.  -Monitor and replace electrolytes - nephrology following, starting lasix gtt.   HEMATOLOGIC A:  Anemia Thrombocytopenia P:  -Monitor CBC  ENDOCRINE A:  Mild hyperglycemia without a diagnosis of DM P:  -Monitor blood glucose with BMP  NEUROLOGIC A:  Acute encephalopathy-resolving P:  RASS goal: n/a -Monitor mental status   Thank you for  consulting [facility], Please feel free to contacts [place] with any questions at [phone] (please enter 7-digits).  I have personally obtained a history, examined the patient, evaluated laboratory and imaging results, formulated the assessment and plan and placed orders.  Pulmonary Care Time devoted to patient care services described in this note is 40 minutes.    Stephanie Acre, MD [facility] Pager [phone] (please enter 7-digits) On Call Pager - [phone] (please enter 7-digits)  Note: This note was prepared with Dragon dictation along with smaller phrase technology. Any transcriptional errors that result from this process are unintentional.

## 2016-01-10 NOTE — Progress Notes (Signed)
Chaplain rounded the unit and provided a compassionate presence and spiritual support to the patient and family.  Chaplain Koben Daman [phone] 

## 2016-01-10 NOTE — Consult Note (Signed)
[facility]  CONSULT NOTE    Date: 01/10/2016                  Patient Name:  Jermaine Fields  MRN: [id number]  DOB: 21-Mar-1930  Age / Sex: 82 y.o., male         PCP: Letta Median, MD                 Service Requesting Consult: Dr. Stevenson Clinch                 Reason for Consult: Acute Renal Failure            History of Present Illness: Jermaine Fields is a 82 y.o. black male with hypertension, BPH, allergies, who was admitted to [facility] on 01/05/2016 for Hypokalemia [E87.6] Ventricular tachycardia (Gleason) [I47.2] Cardiac enzymes elevated [R74.8] Hypotension, unspecified hypotension type [I95.9]   Wife at bedside who assists with history taking. Patient admitted with v-tach and has converted to sinus rhythm, currently on amiodarone. On admission, creatinine of 1.47, eGFR of 49. Furosemide given for pulmonary edema. However stopped on 4/18 due to hypotension and elevated creatinine. Now with HFNC and orthopnea.   Diagnosed with systolic congestive heart failure with EF of 20-25%.   Also hospital course complicated by gross hematuria. He is now off heparin gtt.   Patient unsure if he has chronic kidney disease.    Medications: Outpatient medications: Prescriptions prior to admission  Medication Sig Dispense Refill Last Dose  . amLODipine (NORVASC) 10 MG tablet Take 10 mg by mouth daily.   01/04/2016 at Unknown time  . aspirin EC 81 MG tablet Take 81 mg by mouth daily.   01/04/2016 at Unknown time  . cetirizine (ZYRTEC) 10 MG tablet Take 10 mg by mouth daily.   01/04/2016 at Unknown time  . cloNIDine (CATAPRES) 0.1 MG tablet Take 0.1 mg by mouth 2 (two) times daily.   01/04/2016 at Unknown time  . doxazosin (CARDURA) 2 MG tablet Take 2 mg by mouth daily.   01/04/2016 at Unknown time  . fluticasone (FLONASE) 50 MCG/ACT nasal spray Place 2 sprays into both nostrils daily as needed. For sinus congestion.   01/04/2016 at Unknown time  . lisinopril (PRINIVIL,ZESTRIL)  40 MG tablet Take 40 mg by mouth daily.   01/04/2016 at Unknown time    Current medications: Current Facility-Administered Medications  Medication Dose Route Frequency Provider Last Rate Last Dose  . acetaminophen (TYLENOL) tablet 650 mg  650 mg Oral Q6H PRN Dustin Flock, MD       Or  . acetaminophen (TYLENOL) suppository 650 mg  650 mg Rectal Q6H PRN Dustin Flock, MD      . amiodarone (PACERONE) tablet 400 mg  400 mg Oral BID Deboraha Sprang, MD   400 mg at 01/10/16 1041  . antiseptic oral rinse (CPC / CETYLPYRIDINIUM CHLORIDE 0.05%) solution 7 mL  7 mL Mouth Rinse BID Dustin Flock, MD   7 mL at 01/10/16 1046  . aspirin EC tablet 81 mg  81 mg Oral Daily Deboraha Sprang, MD   81 mg at 01/10/16 1042  . atorvastatin (LIPITOR) tablet 40 mg  40 mg Oral q1800 Demetrios Loll, MD   40 mg at 01/09/16 1744  . doxazosin (CARDURA) tablet 2 mg  2 mg Oral Daily Demetrios Loll, MD   2 mg at 01/10/16 1041  . heparin injection 5,000 Units  5,000 Units Subcutaneous Q8H Vishal Mungal, MD   5,000  Units at 01/10/16 0542  . hydrALAZINE (APRESOLINE) injection 10 mg  10 mg Intravenous Q4H PRN Mikael Spray, NP   10 mg at 01/06/16 2348  . ipratropium (ATROVENT) nebulizer solution 0.5 mg  0.5 mg Nebulization Q6H PRN Vishal Mungal, MD       And  . levalbuterol (XOPENEX) nebulizer solution 1.25 mg  1.25 mg Nebulization Q6H PRN Vishal Mungal, MD      . loratadine (CLARITIN) tablet 10 mg  10 mg Oral Daily Demetrios Loll, MD   10 mg at 01/10/16 1042  . metoprolol succinate (TOPROL-XL) 24 hr tablet 12.5 mg  12.5 mg Oral BID Max Sane, MD   12.5 mg at 01/10/16 1041  . morphine 2 MG/ML injection 2 mg  2 mg Intravenous Q1H PRN Flora Lipps, MD   2 mg at 01/07/16 1434  . ondansetron (ZOFRAN) tablet 4 mg  4 mg Oral Q6H PRN Dustin Flock, MD       Or  . ondansetron (ZOFRAN) injection 4 mg  4 mg Intravenous Q6H PRN Dustin Flock, MD      . predniSONE (DELTASONE) tablet 30 mg  30 mg Oral Q breakfast Vishal Mungal, MD   30 mg at  01/10/16 0819  . sodium chloride flush (NS) 0.9 % injection 3 mL  3 mL Intravenous Q12H Dustin Flock, MD   3 mL at 01/10/16 1042       Allergies: Allergies  Allergen Reactions  . Vitamin D2 [Ergocalciferol] Other (See Comments)    This medication made the patient too hot.      Past Medical History: Past Medical History  Diagnosis Date  . Hypertension   . BPH (benign prostatic hyperplasia)   . Environmental allergies      Past Surgical History: Past Surgical History  Procedure Laterality Date  . Cholecystectomy       Family History: Family History  Problem Relation Age of Onset  . Hypertension       Social History: Social History   Social History  . Marital Status: Married    Spouse Name: N/A  . Number of Children: N/A  . Years of Education: N/A   Occupational History  . Not on file.   Social History Main Topics  . Smoking status: Former Research scientist (life sciences)  . Smokeless tobacco: Not on file  . Alcohol Use: No  . Drug Use: No  . Sexual Activity: Not on file   Other Topics Concern  . Not on file   Social History Narrative  . No narrative on file     Review of Systems: Review of Systems  Constitutional: Positive for diaphoresis. Negative for fever, chills, weight loss and malaise/fatigue.  HENT: Negative.  Negative for congestion, ear discharge, ear pain, hearing loss, nosebleeds, sore throat and tinnitus.   Eyes: Negative.  Negative for blurred vision, double vision, photophobia, pain, discharge and redness.  Respiratory: Positive for cough, hemoptysis, sputum production, shortness of breath and wheezing. Negative for stridor.   Cardiovascular: Positive for chest pain, palpitations, orthopnea, leg swelling and PND. Negative for claudication.  Gastrointestinal: Negative.  Negative for heartburn, nausea, vomiting, abdominal pain, diarrhea, constipation, blood in stool and melena.  Genitourinary: Negative for dysuria, urgency, frequency, hematuria and flank pain.   Musculoskeletal: Negative.  Negative for myalgias, back pain, joint pain, falls and neck pain.  Skin: Negative.  Negative for itching and rash.  Neurological: Positive for weakness. Negative for dizziness, tingling, tremors, sensory change, speech change, focal weakness, seizures, loss of consciousness and headaches.  Endo/Heme/Allergies: Negative for environmental allergies and polydipsia. Does not bruise/bleed easily.  Psychiatric/Behavioral: Negative.  Negative for depression, suicidal ideas, hallucinations, memory loss and substance abuse. The patient is not nervous/anxious and does not have insomnia.     Vital Signs: Blood pressure 153/67, pulse 85, temperature 98.4 F (36.9 C), temperature source Oral, resp. rate 20, height 5' 11"  (1.803 m), weight 95.1 kg (209 lb 10.5 oz), SpO2 91 %.  Weight trends: Filed Weights   01/05/16 0914 01/05/16 1808 01/06/16 0630  Weight: 81.647 kg (180 lb) 94.1 kg (207 lb 7.3 oz) 95.1 kg (209 lb 10.5 oz)    Physical Exam: General: Critically ill  Head: Normocephalic, atraumatic. Moist oral mucosal membranes  Eyes: Anicteric, PERRL  Neck: Supple, trachea midline  Lungs:  HFNC   Heart: Regular rate and rhythm, PAC's PVCs  Abdomen:  Soft, nontender  Extremities: ++ peripheral edema.  Neurologic: Nonfocal, moving all four extremities, hard to understand speech  Skin: No lesions  Access: none     Lab results: Basic Metabolic Panel:  Recent Labs Lab 01/05/16 0920  01/05/16 1957  01/06/16 1036 01/07/16 0431 01/08/16 0411 01/09/16 0444 01/10/16 0758  NA 139  < >  --   < > 137 138 136 139 140  K 2.9*  < >  --   < > 3.8 3.8 3.6 3.6 3.8  CL 111  < >  --   < > 108 108 104 106 107  CO2 20*  < >  --   < > 22 21* 24 25 26   GLUCOSE 181*  < >  --   < > 138* 111* 108* 108* 99  BUN 21*  < >  --   < > 32* 42* 48* 60* 59*  CREATININE 1.47*  < >  --   < > 2.05* 2.33* 2.47* 2.49* 2.24*  CALCIUM 8.2*  < >  --   < > 8.8* 8.8* 8.3* 8.7* 8.7*  MG 1.9  --   1.9  --  2.2 2.1  --   --   --   PHOS  --   --   --   --  3.2 3.3  --   --   --   < > = values in this interval not displayed.  Liver Function Tests:  Recent Labs Lab 01/05/16 0920 01/06/16 1036  AST 24 35  ALT 22 38  ALKPHOS 71 83  BILITOT 0.8 0.5  PROT 6.6 6.9  ALBUMIN 3.0* 3.1*   No results for input(s): LIPASE, AMYLASE in the last 168 hours. No results for input(s): AMMONIA in the last 168 hours.  CBC:  Recent Labs Lab 01/06/16 0443 01/06/16 1036 01/07/16 0431 01/08/16 0411 01/09/16 0444  WBC 11.8* 11.7* 10.7* 9.7 6.9  NEUTROABS  --  9.7*  --   --   --   HGB 12.2* 12.0* 11.9* 10.6* 10.6*  HCT 37.6* 36.5* 36.9* 31.9* 32.6*  MCV 86.5 85.3 86.8 87.8 86.6  PLT 153 154 135* 120* 119*    Cardiac Enzymes:  Recent Labs Lab 01/05/16 0920 01/05/16 1650 01/05/16 2327  TROPONINI 0.22* 2.16* 3.23*    BNP: Invalid input(s): POCBNP  CBG:  Recent Labs Lab 01/05/16 1808  GLUCAP 144*    Microbiology: Results for orders placed or performed during the hospital encounter of 01/05/16  MRSA PCR Screening     Status: None   Collection Time: 01/05/16  6:23 PM  Result Value Ref Range Status   MRSA by PCR  NEGATIVE NEGATIVE Final    Comment:        The GeneXpert MRSA Assay (FDA approved for NASAL specimens only), is one component of a comprehensive MRSA colonization surveillance program. It is not intended to diagnose MRSA infection nor to guide or monitor treatment for MRSA infections.     Coagulation Studies: No results for input(s): LABPROT, INR in the last 72 hours.  Urinalysis: No results for input(s): COLORURINE, LABSPEC, PHURINE, GLUCOSEU, HGBUR, BILIRUBINUR, KETONESUR, PROTEINUR, UROBILINOGEN, NITRITE, LEUKOCYTESUR in the last 72 hours.  Invalid input(s): APPERANCEUR    Imaging:  No results found.   Assessment & Plan: Jermaine Fields is a 82 y.o. black male with hypertension, BPH, allergies, who was admitted to [facility] on 01/05/2016   1.  Acute renal failure: unknown baseline creatinine. Acute renal failure from acute cardiorenal syndrome, hypotension and poor renal perfusion. Now with fluid overload and not on diuretics. Medications renally dosed.  - holding lisinopril - renal ultrasound reviewed - will start IV furosemide gtt to see if this helps with urine output and respiratory status.   2. Gross hematuria: also with hemoptysis: thought to be due to hemoptysis. However will check ANCA.   3. Acute exacerbation of systolic congestive heart failure with atrial flutter and v-tach:  - furosemide gtt as above.   4. Anemia and thrombocytopenia: continue to monitor   LOS: Dorado, Amylah Will 4/21/201711:11 AM

## 2016-01-10 NOTE — Progress Notes (Signed)
Patient ID: Jermaine Fields, male   DOB: 12/26/1929, [age] y.o.   MRN: [id number] [employer] PROGRESS NOTE  Jermaine Fields [id number] DOB: 08/11/1930 DOA: 01/05/2016 PCP: Oswaldo ConroyBender, Jermaine Daneele, MD  HPI/Subjective: Patient really doesn't offer much complaints. Some shortness of breath and cough. No complaints of chest pain.  Objective: Filed Vitals:   01/10/16 0600 01/10/16 1041  BP: 131/68 153/67  Pulse: 82 85  Temp:    Resp: 20     Filed Weights   01/05/16 0914 01/05/16 1808 01/06/16 0630  Weight: 81.647 kg (180 lb) 94.1 kg (207 lb 7.3 oz) 95.1 kg (209 lb 10.5 oz)    ROS: Review of Systems  Constitutional: Negative for fever and chills.  Eyes: Negative for blurred vision.  Respiratory: Positive for cough and shortness of breath.   Cardiovascular: Negative for chest pain.  Gastrointestinal: Negative for nausea, vomiting, abdominal pain, diarrhea and constipation.  Genitourinary: Negative for dysuria.  Musculoskeletal: Negative for joint pain.  Neurological: Negative for dizziness and headaches.   Exam: Physical Exam  HENT:  Nose: No mucosal edema.  Mouth/Throat: No oropharyngeal exudate or posterior oropharyngeal edema.  Eyes: Conjunctivae, EOM and lids are normal. Pupils are equal, round, and reactive to light.  Neck: No JVD present. Carotid bruit is not present. No edema present. No thyroid mass and no thyromegaly present.  Cardiovascular: S1 normal and S2 normal.  Exam reveals no gallop.   No murmur heard. Pulses:      Dorsalis pedis pulses are 2+ on the right side, and 2+ on the left side.  Respiratory: No respiratory distress. He has no wheezes. He has no rhonchi. He has rales in the right middle field, the right lower field, the left middle field and the left lower field.  GI: Soft. Bowel sounds are normal. There is no tenderness.  Musculoskeletal:       Right ankle: He exhibits swelling.       Left ankle: He exhibits swelling.  Lymphadenopathy:    He has no  cervical adenopathy.  Neurological: He is alert. No cranial nerve deficit.  Skin: Skin is warm. No rash noted. Nails show no clubbing.  Psychiatric: He has a normal mood and affect.      Data Reviewed: Basic Metabolic Panel:  Recent Labs Lab 01/05/16 0920  01/05/16 1957  01/06/16 1036 01/07/16 0431 01/08/16 0411 01/09/16 0444 01/10/16 0758  NA 139  < >  --   < > 137 138 136 139 140  K 2.9*  < >  --   < > 3.8 3.8 3.6 3.6 3.8  CL 111  < >  --   < > 108 108 104 106 107  CO2 20*  < >  --   < > 22 21* 24 25 26   GLUCOSE 181*  < >  --   < > 138* 111* 108* 108* 99  BUN 21*  < >  --   < > 32* 42* 48* 60* 59*  CREATININE 1.47*  < >  --   < > 2.05* 2.33* 2.47* 2.49* 2.24*  CALCIUM 8.2*  < >  --   < > 8.8* 8.8* 8.3* 8.7* 8.7*  MG 1.9  --  1.9  --  2.2 2.1  --   --   --   PHOS  --   --   --   --  3.2 3.3  --   --   --   < > = values in this interval not displayed.  Liver Function Tests:  Recent Labs Lab 01/05/16 0920 01/06/16 1036  AST 24 35  ALT 22 38  ALKPHOS 71 83  BILITOT 0.8 0.5  PROT 6.6 6.9  ALBUMIN 3.0* 3.1*   CBC:  Recent Labs Lab 01/06/16 0443 01/06/16 1036 01/07/16 0431 01/08/16 0411 01/09/16 0444  WBC 11.8* 11.7* 10.7* 9.7 6.9  NEUTROABS  --  9.7*  --   --   --   HGB 12.2* 12.0* 11.9* 10.6* 10.6*  HCT 37.6* 36.5* 36.9* 31.9* 32.6*  MCV 86.5 85.3 86.8 87.8 86.6  PLT 153 154 135* 120* 119*   Cardiac Enzymes:  Recent Labs Lab 01/05/16 0920 01/05/16 1650 01/05/16 2327  TROPONINI 0.22* 2.16* 3.23*   BNP (last 3 results)  Recent Labs  01/05/16 0920  BNP 1339.0*    CBG:  Recent Labs Lab 01/05/16 1808  GLUCAP 144*    Recent Results (from the past 240 hour(s))  MRSA PCR Screening     Status: None   Collection Time: 01/05/16  6:23 PM  Result Value Ref Range Status   MRSA by PCR NEGATIVE NEGATIVE Final    Comment:        The GeneXpert MRSA Assay (FDA approved for NASAL specimens only), is one component of a comprehensive MRSA  colonization surveillance program. It is not intended to diagnose MRSA infection nor to guide or monitor treatment for MRSA infections.      Scheduled Meds: . amiodarone  400 mg Oral BID  . antiseptic oral rinse  7 mL Mouth Rinse BID  . aspirin EC  81 mg Oral Daily  . atorvastatin  40 mg Oral q1800  . doxazosin  2 mg Oral Daily  . heparin subcutaneous  5,000 Units Subcutaneous Q8H  . loratadine  10 mg Oral Daily  . metoprolol succinate  12.5 mg Oral BID  . predniSONE  30 mg Oral Q breakfast  . sodium chloride flush  3 mL Intravenous Q12H   Continuous Infusions: . furosemide (LASIX) infusion 8 mg/hr (01/10/16 1219)    Assessment/Plan:  1. Acute respiratory failure with hypoxia. Patient currently on 53 % high flow nasal cannula. High risk of cardiopulmonary arrest. 2. Ventricular tachycardia. Patient on oral amiodarone and metoprolol as per cardiology. Patient will likely need defibrillator the patient having so much ectopy not sure if this would continually fire or not. 3. Acute systolic congestive heart failure. Lasix drip started by nephrology. Continue metoprolol. 4. Acute myocardial infarction. Continue aspirin statin and beta blocker. 5. Gross hematuria. Improved off heparin drip. 6. BPH on doxazosin 7. Hyperlipidemia unspecified on atorvastatin 8. I made the patient a DO NOT RESUSCITATE on 01/09/2016.   Code Status:     Code Status Orders        Start     Ordered   01/09/16 1200    DNR   01/05/16 1201    Code Status History    Date Active Date Inactive Code Status Order ID Comments User Context   This patient has a current code status but no historical code status.     Family Communication: Spoke with wife and daughters in the consultation room Disposition Plan: To be determined  Consultants:  Cardiology  Critical care specialist  Nephrology  Time spent: 32 minutes. Patient critically ill. Greater than 50% of time spent in counseling of family,  filling out FMLA paperwork for the daughter and discussing case with critical care specialist and nephrologist.  Alford Highland  [employer]

## 2016-01-10 NOTE — Plan of Care (Signed)
Problem: Fluid Volume: Goal: Ability to maintain a balanced intake and output will improve Outcome: Not Progressing Pt has poor PO intake  Problem: Nutrition: Goal: Adequate nutrition will be maintained Outcome: Not Progressing Poor PO intake, eating <5% of meals at each meal

## 2016-01-10 NOTE — Progress Notes (Signed)
Patient Name: Jermaine Fields      SUBJECTIVE:**denies chest pain; breathing stable Alert and responsive  Past Medical History  Diagnosis Date  . Hypertension   . BPH (benign prostatic hyperplasia)   . Environmental allergies     Scheduled Meds:  Scheduled Meds: . amiodarone  200 mg Oral BID  . antiseptic oral rinse  7 mL Mouth Rinse BID  . aspirin EC  325 mg Oral Daily  . atorvastatin  40 mg Oral q1800  . doxazosin  2 mg Oral Daily  . heparin subcutaneous  5,000 Units Subcutaneous Q8H  . loratadine  10 mg Oral Daily  . metoprolol succinate  12.5 mg Oral BID  . predniSONE  30 mg Oral Q breakfast  . sodium chloride flush  3 mL Intravenous Q12H   Continuous Infusions:  acetaminophen **OR** acetaminophen, hydrALAZINE, ipratropium **AND** levalbuterol, morphine injection, ondansetron **OR** ondansetron (ZOFRAN) IV    PHYSICAL EXAM Filed Vitals:   01/10/16 0300 01/10/16 0400 01/10/16 0500 01/10/16 0600  BP: 153/73 135/74 137/70 131/68  Pulse: 86 83 40 82  Temp:      TempSrc:      Resp: 22 16 16 20   Height:      Weight:      SpO2: 92% 97% 91% 91%   Well developed and nourished in mod resp distress wearing high flow O2 HENT normal Neck supple with JVP-flat Good air movt laterally Irregular rate and rhythm,  Abd-soft with active BS No Clubbing cyanosis   Skin-warm and dry A & Oriented  Grossly normal sensory and motor function   TELEMETRY: Reviewed telemetry pt in sinus with freq PCVCs couplets    Intake/Output Summary (Last 24 hours) at 01/10/16 0954 Last data filed at 01/10/16 0927  Gross per 24 hour  Intake    240 ml  Output    950 ml  Net   -710 ml    LABS: Basic Metabolic Panel:  Recent Labs Lab 01/05/16 1650  01/06/16 0443 01/06/16 1036 01/07/16 0431 01/08/16 0411 01/09/16 0444 01/10/16 0758  NA 136  --  137 137 138 136 139 140  K 4.7  --  3.5 3.8 3.8 3.6 3.6 3.8  CL 109  --  108 108 108 104 106 107  CO2 20*  --  20* 22 21* 24  25 26   GLUCOSE 155*  --  122* 138* 111* 108* 108* 99  BUN 24*  --  30* 32* 42* 48* 60* 59*  CREATININE 1.87*  --  1.75* 2.05* 2.33* 2.47* 2.49* 2.24*  CALCIUM 8.2*  --  8.8* 8.8* 8.8* 8.3* 8.7* 8.7*  MG  --   < >  --  2.2 2.1  --   --   --   PHOS  --   --   --  3.2 3.3  --   --   --   < > = values in this interval not displayed. Cardiac Enzymes: No results for input(s): CKTOTAL, CKMB, CKMBINDEX, TROPONINI in the last 72 hours. CBC:  Recent Labs Lab 01/05/16 0920 01/06/16 0443 01/06/16 1036 01/07/16 0431 01/08/16 0411 01/09/16 0444  WBC 7.1 11.8* 11.7* 10.7* 9.7 6.9  NEUTROABS  --   --  9.7*  --   --   --   HGB 11.4* 12.2* 12.0* 11.9* 10.6* 10.6*  HCT 35.4* 37.6* 36.5* 36.9* 31.9* 32.6*  MCV 87.5 86.5 85.3 86.8 87.8 86.6  PLT 149* 153 154 135* 120* 119*   PROTIME:  No results for input(s): LABPROT, INR in the last 72 hours. Liver Function Tests: No results for input(s): AST, ALT, ALKPHOS, BILITOT, PROT, ALBUMIN in the last 72 hours. No results for input(s): LIPASE, AMYLASE in the last 72 hours. BNP: BNP (last 3 results)  Recent Labs  01/05/16 0920  BNP 1339.0*    ProBNP (last 3 results) No results for input(s): PROBNP in the last 8760 hours.  D-Dimer:   ASSESSMENT AND PLAN:  Principal Problem:   Ventricular tachycardia (HCC) Active Problems:   Acute respiratory failure (HCC)   Acute systolic CHF (congestive heart failure) (HCC)   NSTEMI (non-ST elevated myocardial infarction) (HCC)   I saw the patient yesterday i.e. 4/20. I spoke with the patient and his wife. Continue amiodarone for complex ventricular ectopy which may be contributing to cardiomyopathy as well as ventricular tachycardia. I reviewed the tracings from EMS and agree it most consistent with ventricular tachycardia.  His IV amiodarone was changed to by mouth; I have taken the liberty of increasing from 200--400 mg twice daily.  Catheterization is indicated; this needs to be deferred until his  renal function and improves and respiratory function stable.  I will be available as needed and will plan to see the patient next Tuesday Signed, Sherryl Manges MD  01/10/2016

## 2016-01-11 LAB — BASIC METABOLIC PANEL
Anion gap: 8 (ref 5–15)
BUN: 60 mg/dL — ABNORMAL HIGH (ref 6–20)
CALCIUM: 8.8 mg/dL — AB (ref 8.9–10.3)
CO2: 27 mmol/L (ref 22–32)
CREATININE: 2.32 mg/dL — AB (ref 0.61–1.24)
Chloride: 106 mmol/L (ref 101–111)
GFR calc non Af Amer: 24 mL/min — ABNORMAL LOW (ref >60)
GFR, EST AFRICAN AMERICAN: 28 mL/min — AB (ref >60)
Glucose, Bld: 103 mg/dL — ABNORMAL HIGH (ref 65–99)
Potassium: 3.4 mmol/L — ABNORMAL LOW (ref 3.5–5.1)
SODIUM: 141 mmol/L (ref 135–145)

## 2016-01-11 LAB — CBC
HEMATOCRIT: 33.4 % — AB (ref 40.0–52.0)
Hemoglobin: 10.8 g/dL — ABNORMAL LOW (ref 13.0–18.0)
MCH: 27.8 pg (ref 26.0–34.0)
MCHC: 32.4 g/dL (ref 32.0–36.0)
MCV: 85.9 fL (ref 80.0–100.0)
PLATELETS: 148 10*3/uL — AB (ref 150–440)
RBC: 3.89 MIL/uL — AB (ref 4.40–5.90)
RDW: 15.7 % — AB (ref 11.5–14.5)
WBC: 7.2 10*3/uL (ref 3.8–10.6)

## 2016-01-11 MED ORDER — POTASSIUM CHLORIDE CRYS ER 20 MEQ PO TBCR
20.0000 meq | EXTENDED_RELEASE_TABLET | Freq: Once | ORAL | Status: AC
Start: 2016-01-11 — End: 2016-01-11
  Administered 2016-01-11: 20 meq via ORAL
  Filled 2016-01-11: qty 1

## 2016-01-11 MED ORDER — ENSURE ENLIVE PO LIQD
237.0000 mL | Freq: Two times a day (BID) | ORAL | Status: DC
  Administered 2016-01-11 – 2016-01-15 (×9): 237 mL via ORAL

## 2016-01-11 NOTE — Progress Notes (Signed)
[facility] [place] [facility]   PATIENT NAME: Jermaine Fields    MRN#:  [id number]  DATE OF BIRTH:  03/09/1930  SUBJECTIVE:  Hospital Day: 6 days Jermaine Fields is a 82 y.o. male presenting with Tachycardia .   Overnight events: No overnight events Interval Events: Remains on high flow, no complaints  REVIEW OF SYSTEMS:  CONSTITUTIONAL: No fever, fatigue or weakness.  EYES: No blurred or double vision.  EARS, NOSE, AND THROAT: No tinnitus or ear pain.  RESPIRATORY: No cough, shortness of breath, wheezing or hemoptysis.  CARDIOVASCULAR: No chest pain, orthopnea, edema.  GASTROINTESTINAL: No nausea, vomiting, diarrhea or abdominal pain.  GENITOURINARY: No dysuria, hematuria.  ENDOCRINE: No polyuria, nocturia,  HEMATOLOGY: No anemia, easy bruising or bleeding SKIN: No rash or lesion. MUSCULOSKELETAL: No joint pain or arthritis.   NEUROLOGIC: No tingling, numbness, weakness.  PSYCHIATRY: No anxiety or depression.   DRUG ALLERGIES:   Allergies  Allergen Reactions  . Vitamin D2 [Ergocalciferol] Other (See Comments)    This medication made the patient too hot.    VITALS:  Blood pressure 143/80, pulse 81, temperature 98.6 F (37 C), temperature source Oral, resp. rate 15, height 5\' 11"  (1.803 m), weight 95.1 kg (209 lb 10.5 oz), SpO2 97 %.  PHYSICAL EXAMINATION:  VITAL SIGNS: Filed Vitals:   01/11/16 0900 01/11/16 1100  BP: 146/68 143/80  Pulse: 83 81  Temp:    Resp: 18 15   GENERAL:82 y.o.male currently in Moderate acute distress.  HEAD: Normocephalic, atraumatic.  EYES: Pupils equal, round, reactive to light. Extraocular muscles intact. No scleral icterus.  MOUTH: Moist mucosal membrane. Dentition intact. No abscess noted.  EAR, NOSE, THROAT: Clear without exudates. No external lesions.  NECK: Supple. No thyromegaly. No nodules. No JVD.  PULMONARY: Coarse breath sounds without wheeze rails or rhonci. No use of accessory muscles, Good  respiratory effort. good air entry bilaterally CHEST: Nontender to palpation.  CARDIOVASCULAR: S1 and S2. Regular rate and rhythm. No murmurs, rubs, or gallops. Trace edema. Pedal pulses 2+ bilaterally.  GASTROINTESTINAL: Soft, nontender, nondistended. No masses. Positive bowel sounds. No hepatosplenomegaly.  MUSCULOSKELETAL: No swelling, clubbing, or edema. Range of motion full in all extremities.  NEUROLOGIC: Cranial nerves II through XII are intact. No gross focal neurological deficits. Sensation intact. Reflexes intact.  SKIN: No ulceration, lesions, rashes, or cyanosis. Skin warm and dry. Turgor intact.  PSYCHIATRIC: Mood, affect within normal limits. The patient is awake, alert and oriented x 3. Insight, judgment intact.      LABORATORY PANEL:   CBC  Recent Labs Lab 01/11/16 0518  WBC 7.2  HGB 10.8*  HCT 33.4*  PLT 148*   ------------------------------------------------------------------------------------------------------------------  Chemistries   Recent Labs Lab 01/06/16 1036 01/07/16 0431  01/11/16 0518  NA 137 138  < > 141  K 3.8 3.8  < > 3.4*  CL 108 108  < > 106  CO2 22 21*  < > 27  GLUCOSE 138* 111*  < > 103*  BUN 32* 42*  < > 60*  CREATININE 2.05* 2.33*  < > 2.32*  CALCIUM 8.8* 8.8*  < > 8.8*  MG 2.2 2.1  --   --   AST 35  --   --   --   ALT 38  --   --   --   ALKPHOS 83  --   --   --   BILITOT 0.5  --   --   --   < > =  values in this interval not displayed. ------------------------------------------------------------------------------------------------------------------  Cardiac Enzymes  Recent Labs Lab 01/05/16 2327  TROPONINI 3.23*   ------------------------------------------------------------------------------------------------------------------  RADIOLOGY:  No results found.  EKG:   Orders placed or performed during the hospital encounter of 01/05/16  . ED EKG  . ED EKG  . EKG 12-Lead  . EKG 12-Lead  . EKG 12-Lead  . EKG 12-Lead   . EKG 12-Lead  . EKG 12-Lead    ASSESSMENT AND PLAN:   Jermaine Fields is a 82 y.o. male presenting with Tachycardia . Admitted 01/05/2016 : Day #: 6 days 1. Acute respiratory failure with hypoxia. Patient currently on  high flow nasal cannula.  2. Ventricular tachycardia. Patient on oral amiodarone and metoprolol as per cardiology.  3. Acute systolic congestive heart failure. Lasix drip started by nephrology. Continue metoprolol. 4. Acute myocardial infarction. Continue aspirin statin and beta blocker. 5. Gross hematuria. Improved off heparin drip. 6. BPH on doxazosin 7. Hyperlipidemia unspecified on atorvastatin    All the records are reviewed and case discussed with Care Management/Social Workerr. Management plans discussed with the patient, family and they are in agreement.  CODE STATUS: dnr TOTAL TIME TAKING CARE OF THIS PATIENT: 28 minutes.   POSSIBLE D/C IN 2-3DAYS, DEPENDING ON CLINICAL CONDITION.   Jermaine Fields,  Mardi Mainland.D on 01/11/2016 at 11:43 AM  Between 7am to 6pm - Pager - [phone]  After 6pm: House Pager: - [phone]  [facility]  Office  [phone]  CC: Primary care physician; Oswaldo Conroy, MD

## 2016-01-11 NOTE — Progress Notes (Signed)
[facility]  ROUNDING NOTE   Subjective:   Furosemide gtt 8mg /hr  UOP 3875  Creatinine 2.32 (2.24)  Continues on HFNC  Objective:  Vital signs in last 24 hours:  Temp:  [98.4 F (36.9 C)-98.9 F (37.2 C)] 98.6 F (37 C) (04/21 1945) Pulse Rate:  [41-90] 81 (04/22 1100) Resp:  [11-24] 15 (04/22 1100) BP: (120-165)/(63-86) 143/80 mmHg (04/22 1100) SpO2:  [88 %-99 %] 97 % (04/22 1100) FiO2 (%):  [45 %-55 %] 45 % (04/22 0026)  Weight change:  Filed Weights   01/05/16 0914 01/05/16 1808 01/06/16 0630  Weight: 81.647 kg (180 lb) 94.1 kg (207 lb 7.3 oz) 95.1 kg (209 lb 10.5 oz)    Intake/Output: I/O last 3 completed shifts: In: 396.5 [P.O.:340; I.V.:56.5] Out: 3875 [Urine:3875]   Intake/Output this shift:     Physical Exam: General: Critically ill  Head: Normocephalic, atraumatic. Moist oral mucosal membranes  Eyes: Anicteric, PERRL  Neck: Supple, trachea midline  Lungs:  HFNC  Heart: Regular rate and rhythm  Abdomen:  Soft, nontender,   Extremities: + peripheral edema.  Neurologic: Nonfocal, moving all four extremities  Skin: No lesions  Access: none    Basic Metabolic Panel:  Recent Labs Lab 01/05/16 0920  01/05/16 1957  01/06/16 1036 01/07/16 0431 01/08/16 0411 01/09/16 0444 01/10/16 0758 01/11/16 0518  NA 139  < >  --   < > 137 138 136 139 140 141  K 2.9*  < >  --   < > 3.8 3.8 3.6 3.6 3.8 3.4*  CL 111  < >  --   < > 108 108 104 106 107 106  CO2 20*  < >  --   < > 22 21* 24 25 26 27   GLUCOSE 181*  < >  --   < > 138* 111* 108* 108* 99 103*  BUN 21*  < >  --   < > 32* 42* 48* 60* 59* 60*  CREATININE 1.47*  < >  --   < > 2.05* 2.33* 2.47* 2.49* 2.24* 2.32*  CALCIUM 8.2*  < >  --   < > 8.8* 8.8* 8.3* 8.7* 8.7* 8.8*  MG 1.9  --  1.9  --  2.2 2.1  --   --   --   --   PHOS  --   --   --   --  3.2 3.3  --   --   --   --   < > = values in this interval not displayed.  Liver Function Tests:  Recent Labs Lab 01/05/16 0920 01/06/16 1036   AST 24 35  ALT 22 38  ALKPHOS 71 83  BILITOT 0.8 0.5  PROT 6.6 6.9  ALBUMIN 3.0* 3.1*   No results for input(s): LIPASE, AMYLASE in the last 168 hours. No results for input(s): AMMONIA in the last 168 hours.  CBC:  Recent Labs Lab 01/06/16 1036 01/07/16 0431 01/08/16 0411 01/09/16 0444 01/11/16 0518  WBC 11.7* 10.7* 9.7 6.9 7.2  NEUTROABS 9.7*  --   --   --   --   HGB 12.0* 11.9* 10.6* 10.6* 10.8*  HCT 36.5* 36.9* 31.9* 32.6* 33.4*  MCV 85.3 86.8 87.8 86.6 85.9  PLT 154 135* 120* 119* 148*    Cardiac Enzymes:  Recent Labs Lab 01/05/16 0920 01/05/16 1650 01/05/16 2327  TROPONINI 0.22* 2.16* 3.23*    BNP: Invalid input(s): POCBNP  CBG:  Recent Labs Lab 01/05/16 1808  GLUCAP 144*  Microbiology: Results for orders placed or performed during the hospital encounter of 01/05/16  MRSA PCR Screening     Status: None   Collection Time: 01/05/16  6:23 PM  Result Value Ref Range Status   MRSA by PCR NEGATIVE NEGATIVE Final    Comment:        The GeneXpert MRSA Assay (FDA approved for NASAL specimens only), is one component of a comprehensive MRSA colonization surveillance program. It is not intended to diagnose MRSA infection nor to guide or monitor treatment for MRSA infections.     Coagulation Studies: No results for input(s): LABPROT, INR in the last 72 hours.  Urinalysis: No results for input(s): COLORURINE, LABSPEC, PHURINE, GLUCOSEU, HGBUR, BILIRUBINUR, KETONESUR, PROTEINUR, UROBILINOGEN, NITRITE, LEUKOCYTESUR in the last 72 hours.  Invalid input(s): APPERANCEUR    Imaging: No results found.   Medications:   . furosemide (LASIX) infusion 8 mg/hr (01/11/16 1015)   . amiodarone  400 mg Oral BID  . antiseptic oral rinse  7 mL Mouth Rinse BID  . aspirin EC  81 mg Oral Daily  . atorvastatin  40 mg Oral q1800  . doxazosin  2 mg Oral Daily  . feeding supplement (ENSURE ENLIVE)  237 mL Oral BID BM  . heparin subcutaneous  5,000 Units  Subcutaneous Q8H  . loratadine  10 mg Oral Daily  . metoprolol succinate  12.5 mg Oral BID  . predniSONE  30 mg Oral Q breakfast  . sodium chloride flush  3 mL Intravenous Q12H   acetaminophen **OR** acetaminophen, hydrALAZINE, ipratropium **AND** levalbuterol, morphine injection, ondansetron **OR** ondansetron (ZOFRAN) IV  Assessment/ Plan:  [name] is a 82 y.o.  male  [name] is a 82 y.o. black male with hypertension, BPH, allergies, who was admitted to [facility] on 01/05/2016   1. Acute renal failure: unknown baseline creatinine. Acute renal failure from acute cardiorenal syndrome, hypotension and poor renal perfusion. Now with fluid overload and not on diuretics. Medications renally dosed.  - holding lisinopril - IV furosemide gtt to see if this helps with urine output and respiratory status.  - replace potassium  2. Gross hematuria: also with hemoptysis: thought to be due to hemoptysis. However will check ANCA.   3. Acute exacerbation of systolic congestive heart failure with atrial flutter and v-tach:  - furosemide gtt as above.   4. Anemia and thrombocytopenia: continue to monitor    LOS: 6 Demiya Magno 4/22/201711:42 AM

## 2016-01-11 NOTE — Progress Notes (Signed)
[facility]  SUBJECTIVE: no further tachycardia events   Filed Vitals:   01/11/16 1200 01/11/16 1300 01/11/16 1400 01/11/16 1524  BP: 144/55 137/50  153/68  Pulse: 77 87 76 85  Temp:    97.8 F (36.6 C)  TempSrc:    Oral  Resp: 18 17  18   Height:      Weight:      SpO2: 93% 97%  95%    Intake/Output Summary (Last 24 hours) at 01/11/16 1619 Last data filed at 01/11/16 1524  Gross per 24 hour  Intake     32 ml  Output   2400 ml  Net  -2368 ml    LABS: Basic Metabolic Panel:  Recent Labs  29/56/2104/21/17 0758 01/11/16 0518  NA 140 141  K 3.8 3.4*  CL 107 106  CO2 26 27  GLUCOSE 99 103*  BUN 59* 60*  CREATININE 2.24* 2.32*  CALCIUM 8.7* 8.8*   Liver Function Tests: No results for input(s): AST, ALT, ALKPHOS, BILITOT, PROT, ALBUMIN in the last 72 hours. No results for input(s): LIPASE, AMYLASE in the last 72 hours. CBC:  Recent Labs  01/09/16 0444 01/11/16 0518  WBC 6.9 7.2  HGB 10.6* 10.8*  HCT 32.6* 33.4*  MCV 86.6 85.9  PLT 119* 148*   Cardiac Enzymes: No results for input(s): CKTOTAL, CKMB, CKMBINDEX, TROPONINI in the last 72 hours. BNP: Invalid input(s): POCBNP D-Dimer: No results for input(s): DDIMER in the last 72 hours. Hemoglobin A1C: No results for input(s): HGBA1C in the last 72 hours. Fasting Lipid Panel: No results for input(s): CHOL, HDL, LDLCALC, TRIG, CHOLHDL, LDLDIRECT in the last 72 hours. Thyroid Function Tests: No results for input(s): TSH, T4TOTAL, T3FREE, THYROIDAB in the last 72 hours.  Invalid input(s): FREET3 Anemia Panel: No results for input(s): VITAMINB12, FOLATE, FERRITIN, TIBC, IRON, RETICCTPCT in the last 72 hours.   Physical Exam: Blood pressure 153/68, pulse 85, temperature 97.8 F (36.6 C), temperature source Oral, resp. rate 18, height 5\' 11"  (1.803 m), weight 95.1 kg (209 lb 10.5 oz), SpO2 95 %.   Wt Readings from Last 1 Encounters:  01/06/16 95.1 kg (209 lb 10.5 oz)      General appearance: moderately obese and slowed mentation Resp: rhonchi bibasilar Cardio: regular rate and rhythm Neurologic: Mental status: alertness: lethargic  TELEMETRY: Reviewed telemetry pt in nsr with pvcs:  ASSESSMENT AND PLAN:  Principal Problem:   Ventricular tachycardia (HCC)no further sustiained vt. Continue with amiodarone.  Active Problems:   Acute respiratory failure (HCC)   Acute systolic CHF (congestive heart failure) (HCC)   NSTEMI (non-ST elevated myocardial infarction) (HCC)   Acute on chronic renal insufficiency (HCC)    Jermaine HeadingFATH,Jermaine Gruner A., MD, [facility] 01/11/2016 4:19 PM

## 2016-01-11 NOTE — Progress Notes (Addendum)
Initial Nutrition Assessment  DOCUMENTATION CODES:   Not applicable   INTERVENTION:   -Recommend downgrading diet order to Dysphagia III as pt does not have dentures with him and cannot chew hard foods such as bagel on breakfast tray this morning. -Recommend Ensure Enlive po BID, each supplement provides 350 kcal and 20 grams of protein. RD notes pt with vitamin D2 allergy listed with comment that it makes pt hot. Pt reports drinking Boost daily without any trouble (RD notes both Ensure Enlive and Boost Original contain Vitamin D3). Recommend discontinuing if not tolerating. -Will send Magic Cup on lunch and dinner trays as well   NUTRITION DIAGNOSIS:   Inadequate oral intake related to acute illness as evidenced by per patient/family report, meal completion < 25%.  GOAL:   Patient will meet greater than or equal to 90% of their needs  MONITOR:   PO intake, Supplement acceptance, Labs, Weight trends, I & O's  REASON FOR ASSESSMENT:   Consult Poor PO  ASSESSMENT:   Pt admitted with tachycardia, s/p MI, high risk for intubation cannot lie flat per MD note. Pt now followed by nephrology for fluid overload on IV Lasix. Palliative care folowing. Pt on HFNC.  Past Medical History  Diagnosis Date  . Hypertension   . BPH (benign prostatic hyperplasia)   . Environmental allergies     Diet Order:  DIET DYS 3 Room service appropriate?: Yes; Fluid consistency:: Thin   Pt reports good appetite PTA usually eating 2 meals per day and snack of lance crackers with a pepsi midday. Pt currently a feeder, family at bedside assisting. Per family pt currently eating applesauce, ice cream, liquids and soft foods only. Pt reports currently not having teeth and not tolerating tough foods.  Recorded po intake <25% since admission. Pt family member reports pt drinks Boost 1-2 per day PTA likes vanilla.   Medications: Amiodarone, Prednisone, IV Lasix Labs: K 3.4 this am, BUN and Cre  elevated   Gastrointestinal Profile: Last BM:  01/10/2016   Nutrition-Focused Physical Exam Findings: Nutrition-Focused physical exam completed. Findings are no fat depletion of orbital and tricep regions however unable to assess ribs, no muscle depletion of clavicle, hand, however unable to assess legs or scapula region mild deletion of acromium process and 1+ edema of lower extremities per RN documentation. RD attempted physical assessment however unable to complete fully.   Weight Change: Pt reports UBW of 170-180lbs, current weight of 209lbs.   Skin:  Reviewed, no issues  Last BM:  01/10/2016  Height:   Ht Readings from Last 1 Encounters:  01/06/16 5\' 11"  (1.803 m)    Weight:   Wt Readings from Last 1 Encounters:  01/06/16 209 lb 10.5 oz (95.1 kg)    Ideal Body Weight:   78kg  BMI:  Body mass index is 29.25 kg/(m^2).  Estimated Nutritional Needs:   Kcal:  1950-2300kcals  Protein:  78-94g protein  Fluid:  1.9-2.2L fluid  EDUCATION NEEDS:   No education needs identified at this time  Leda QuailAllyson Jermiah Soderman, RD, LDN Pager [phone] Weekend/On-Call Pager [phone]

## 2016-01-12 LAB — BASIC METABOLIC PANEL
Anion gap: 9 (ref 5–15)
BUN: 57 mg/dL — AB (ref 6–20)
CHLORIDE: 107 mmol/L (ref 101–111)
CO2: 28 mmol/L (ref 22–32)
Calcium: 9 mg/dL (ref 8.9–10.3)
Creatinine, Ser: 2.46 mg/dL — ABNORMAL HIGH (ref 0.61–1.24)
GFR calc Af Amer: 26 mL/min — ABNORMAL LOW (ref >60)
GFR calc non Af Amer: 23 mL/min — ABNORMAL LOW (ref >60)
GLUCOSE: 103 mg/dL — AB (ref 65–99)
POTASSIUM: 3.3 mmol/L — AB (ref 3.5–5.1)
Sodium: 144 mmol/L (ref 135–145)

## 2016-01-12 MED ORDER — FUROSEMIDE 10 MG/ML IJ SOLN
40.0000 mg | Freq: Two times a day (BID) | INTRAMUSCULAR | Status: DC
  Administered 2016-01-12 – 2016-01-13 (×3): 40 mg via INTRAVENOUS
  Filled 2016-01-12 (×3): qty 4

## 2016-01-12 MED ORDER — POTASSIUM CHLORIDE CRYS ER 20 MEQ PO TBCR
40.0000 meq | EXTENDED_RELEASE_TABLET | Freq: Once | ORAL | Status: AC
  Administered 2016-01-12: 40 meq via ORAL
  Filled 2016-01-12: qty 2

## 2016-01-12 MED ORDER — POTASSIUM CHLORIDE CRYS ER 10 MEQ PO TBCR
30.0000 meq | EXTENDED_RELEASE_TABLET | Freq: Once | ORAL | Status: AC
  Administered 2016-01-12: 30 meq via ORAL
  Filled 2016-01-12: qty 1

## 2016-01-12 NOTE — Evaluation (Signed)
Physical Therapy Evaluation Patient Details Name: Jermaine Fields MRN: [id number] DOB: Feb 11, 1930 Today's Date: 01/12/2016   History of Present Illness  82 yo male with onset of tachycardia, has NSTEMI, CHF and recent respiratory failure.  PMHx:  HLD, CHF  Clinical Impression  Pt is up to walk with increasing fatigue sitting bedside to get meds and ck vitals.  He is not steady on his feet, and should be assisted for all mobility as he is higher fall risk.   Will work on standing and gait as his cardiac function tolerates due to recent MI and will have pt sit up in chair to build energy as he is able.    Follow Up Recommendations SNF    Equipment Recommendations  Rolling walker with 5" wheels (if a good walker not available)    Recommendations for Other Services Rehab consult     Precautions / Restrictions Precautions Precautions: Fall (telemetry) Restrictions Weight Bearing Restrictions: No      Mobility  Bed Mobility Overal bed mobility: Needs Assistance Bed Mobility: Supine to Sit;Sit to Supine     Supine to sit: Mod assist Sit to supine: Mod assist      Transfers Overall transfer level: Needs assistance Equipment used: 1 person hand held assist Transfers: Sit to/from Stand Sit to Stand: Mod assist         General transfer comment: cues for hand placement  Ambulation/Gait Ambulation/Gait assistance: Mod assist (sidesteps up bed) Ambulation Distance (Feet): 3 Feet Assistive device: 1 person hand held assist Gait Pattern/deviations: Step-to pattern;Wide base of support;Trunk flexed Gait velocity: reduced Gait velocity interpretation: Below normal speed for age/gender General Gait Details: Pt is weak but confused as well  Stairs            Wheelchair Mobility    Modified Rankin (Stroke Patients Only)       Balance Overall balance assessment: History of Falls                                           Pertinent Vitals/Pain Pain  Assessment: No/denies pain    Home Living Family/patient expects to be discharged to:: Skilled nursing facility Living Arrangements: Spouse/significant other Available Help at Discharge: Family;Available 24 hours/day Type of Home: House Home Access: Level entry     Home Layout: One level Home Equipment: Walker - 2 wheels;Cane - single point [facility] is PLOF per pt)      Prior Function Level of Independence: Independent with assistive device(s) (but several falls per wife)               Hand Dominance   Dominant Hand: Right    Extremity/Trunk Assessment   Upper Extremity Assessment: Generalized weakness           Lower Extremity Assessment: Generalized weakness (strength tests fine but cannot support himself on his legs)      Cervical / Trunk Assessment: Normal  Communication   Communication: HOH  Cognition Arousal/Alertness: Lethargic;Awake/alert Behavior During Therapy: Flat affect;Agitated (agitated when PT and wife talked about inpt rehab) Overall Cognitive Status: History of cognitive impairments - at baseline       Memory: Decreased recall of precautions;Decreased short-term memory              General Comments General comments (skin integrity, edema, etc.): Pt is up to sit with PT but cannot maintain sitting balance once LE's move.  His plan is to get therapy in SNF and will then go home with wife who cannot help alone.    Exercises        Assessment/Plan    PT Assessment Patient needs continued PT services  PT Diagnosis Generalized weakness   PT Problem List Decreased strength;Decreased range of motion;Decreased activity tolerance;Decreased balance;Decreased mobility;Decreased coordination;Decreased cognition;Decreased knowledge of use of DME;Decreased safety awareness;Decreased knowledge of precautions;Cardiopulmonary status limiting activity;Obesity  PT Treatment Interventions DME instruction;Stair training;Gait training;Functional mobility  training;Therapeutic activities;Therapeutic exercise;Balance training;Neuromuscular re-education;Patient/family education   PT Goals (Current goals can be found in the Care Plan section) Acute Rehab PT Goals Patient Stated Goal: to get up to walk again PT Goal Formulation: With patient/family Time For Goal Achievement: 01/26/16 Potential to Achieve Goals: Good    Frequency Min 2X/week   Barriers to discharge Inaccessible home environment;Decreased caregiver support (wife is not strong enough to assist pt) Pt will need to be up to walk to get home    Co-evaluation               End of Session Equipment Utilized During Treatment: Oxygen Activity Tolerance: Patient limited by lethargy;Patient limited by fatigue;Treatment limited secondary to medical complications (Comment) (monitored O2 sats with values remaining in the 90's) Patient left: in bed;with bed alarm set;with call bell/phone within reach;with family/visitor present;with nursing/sitter in room Nurse Communication: Mobility status         Time: 1203-1229 PT Time Calculation (min) (ACUTE ONLY): 26 min   Charges:   PT Evaluation $PT Eval Moderate Complexity: 1 Procedure PT Treatments $Therapeutic Activity: 8-22 mins   PT G Codes:        Ivar DrapeStout, Keven Osborn E 01/12/2016, 1:27 PM    Samul Dadauth Gilmar Bua, PT MS Acute Rehab Dept. Number: [facility] [id number] and [facility] [phone]

## 2016-01-12 NOTE — Progress Notes (Signed)
[facility]  SUBJECTIVE: on nasal canula oxygen. Feels better   Filed Vitals:   01/12/16 0549 01/12/16 0735 01/12/16 0935 01/12/16 1200  BP:  143/59  118/61  Pulse: 88 80  80  Temp:  98.3 F (36.8 C)    TempSrc:      Resp:  20    Height:      Weight:  88.814 kg (195 lb 12.8 oz) 84.823 kg (187 lb)   SpO2:  94%  95%    Intake/Output Summary (Last 24 hours) at 01/12/16 1550 Last data filed at 01/12/16 1418  Gross per 24 hour  Intake    120 ml  Output   3400 ml  Net  -3280 ml    LABS: Basic Metabolic Panel:  Recent Labs  16/06/9603/22/17 0518 01/12/16 0555  NA 141 144  K 3.4* 3.3*  CL 106 107  CO2 27 28  GLUCOSE 103* 103*  BUN 60* 57*  CREATININE 2.32* 2.46*  CALCIUM 8.8* 9.0   Liver Function Tests: No results for input(s): AST, ALT, ALKPHOS, BILITOT, PROT, ALBUMIN in the last 72 hours. No results for input(s): LIPASE, AMYLASE in the last 72 hours. CBC:  Recent Labs  01/11/16 0518  WBC 7.2  HGB 10.8*  HCT 33.4*  MCV 85.9  PLT 148*   Cardiac Enzymes: No results for input(s): CKTOTAL, CKMB, CKMBINDEX, TROPONINI in the last 72 hours. BNP: Invalid input(s): POCBNP D-Dimer: No results for input(s): DDIMER in the last 72 hours. Hemoglobin A1C: No results for input(s): HGBA1C in the last 72 hours. Fasting Lipid Panel: No results for input(s): CHOL, HDL, LDLCALC, TRIG, CHOLHDL, LDLDIRECT in the last 72 hours. Thyroid Function Tests: No results for input(s): TSH, T4TOTAL, T3FREE, THYROIDAB in the last 72 hours.  Invalid input(s): FREET3 Anemia Panel: No results for input(s): VITAMINB12, FOLATE, FERRITIN, TIBC, IRON, RETICCTPCT in the last 72 hours.   Physical Exam: Blood pressure 118/61, pulse 80, temperature 98.3 F (36.8 C), temperature source Oral, resp. rate 20, height 5\' 11"  (1.803 m), weight 84.823 kg (187 lb), SpO2 95 %.   Wt Readings from Last 1 Encounters:  01/12/16 84.823 kg (187 lb)     General appearance:  cooperative Resp: rhonchi bilaterally Cardio: regular rate and rhythm GI: soft, non-tender; bowel sounds normal; no masses,  no organomegaly Neurologic: Grossly normal  TELEMETRY: Reviewed telemetry pt in nsr:  ASSESSMENT AND PLAN:  Principal Problem:   Ventricular tachycardia (HCC)no further vt. Will continue with current regimen. Pt defers consideration for aicd.  Active Problems:   Acute respiratory failure (HCC)   Acute systolic CHF (congestive heart failure) (HCC)   NSTEMI (non-ST elevated myocardial infarction) (HCC)   Acute on chronic renal insufficiency (HCC)    Dalia HeadingFATH,Lavar Rosenzweig A., MD, [facility]FACC 01/12/2016 3:50 PM

## 2016-01-12 NOTE — Progress Notes (Signed)
[facility] [place] [facility]   PATIENT NAME: Jermaine Fields    MRN#:  [id number]  DATE OF BIRTH:  01/15/1930  SUBJECTIVE:  Hospital Day: 7 days Jermaine Fields is a 82 y.o. male presenting with Tachycardia .   Overnight events: No overnight events Interval Events: Currently off of high flow nasal cannula, no complaints, wife at bedside  REVIEW OF SYSTEMS:  CONSTITUTIONAL: No fever, fatigue or weakness.  EYES: No blurred or double vision.  EARS, NOSE, AND THROAT: No tinnitus or ear pain.  RESPIRATORY: No cough, shortness of breath, wheezing or hemoptysis.  CARDIOVASCULAR: No chest pain, orthopnea, edema.  GASTROINTESTINAL: No nausea, vomiting, diarrhea or abdominal pain.  GENITOURINARY: No dysuria, hematuria.  ENDOCRINE: No polyuria, nocturia,  HEMATOLOGY: No anemia, easy bruising or bleeding SKIN: No rash or lesion. MUSCULOSKELETAL: No joint pain or arthritis.   NEUROLOGIC: No tingling, numbness, weakness.  PSYCHIATRY: No anxiety or depression.   DRUG ALLERGIES:   Allergies  Allergen Reactions  . Vitamin D2 [Ergocalciferol] Other (See Comments)    This medication made the patient too hot.    VITALS:  Blood pressure 143/59, pulse 80, temperature 98.3 F (36.8 C), temperature source Oral, resp. rate 20, height 5\' 11"  (1.803 m), weight 84.823 kg (187 lb), SpO2 94 %.  PHYSICAL EXAMINATION:  VITAL SIGNS: Filed Vitals:   01/12/16 0549 01/12/16 0735  BP:  143/59  Pulse: 88 80  Temp:  98.3 F (36.8 C)  Resp:  20   GENERAL:82 y.o.male currently in No acute distress.  HEAD: Normocephalic, atraumatic.  EYES: Pupils equal, round, reactive to light. Extraocular muscles intact. No scleral icterus.  MOUTH: Moist mucosal membrane. Dentition intact. No abscess noted.  EAR, NOSE, THROAT: Clear without exudates. No external lesions.  NECK: Supple. No thyromegaly. No nodules. No JVD.  PULMONARY: Coarse breath sounds without wheeze rails or rhonci. No  use of accessory muscles, Good respiratory effort. good air entry bilaterally CHEST: Nontender to palpation.  CARDIOVASCULAR: S1 and S2. Regular rate and rhythm. No murmurs, rubs, or gallops. Trace edema. Pedal pulses 2+ bilaterally.  GASTROINTESTINAL: Soft, nontender, nondistended. No masses. Positive bowel sounds. No hepatosplenomegaly.  MUSCULOSKELETAL: No swelling, clubbing, or edema. Range of motion full in all extremities.  NEUROLOGIC: Cranial nerves II through XII are intact. No gross focal neurological deficits. Sensation intact. Reflexes intact.  SKIN: No ulceration, lesions, rashes, or cyanosis. Skin warm and dry. Turgor intact.  PSYCHIATRIC: Mood, affect within normal limits. The patient is awake, alert and oriented x 3. Insight, judgment intact.      LABORATORY PANEL:   CBC  Recent Labs Lab 01/11/16 0518  WBC 7.2  HGB 10.8*  HCT 33.4*  PLT 148*   ------------------------------------------------------------------------------------------------------------------  Chemistries   Recent Labs Lab 01/06/16 1036 01/07/16 0431  01/12/16 0555  NA 137 138  < > 144  K 3.8 3.8  < > 3.3*  CL 108 108  < > 107  CO2 22 21*  < > 28  GLUCOSE 138* 111*  < > 103*  BUN 32* 42*  < > 57*  CREATININE 2.05* 2.33*  < > 2.46*  CALCIUM 8.8* 8.8*  < > 9.0  MG 2.2 2.1  --   --   AST 35  --   --   --   ALT 38  --   --   --   ALKPHOS 83  --   --   --   BILITOT 0.5  --   --   --   < > =  values in this interval not displayed. ------------------------------------------------------------------------------------------------------------------  Cardiac Enzymes  Recent Labs Lab 01/05/16 2327  TROPONINI 3.23*   ------------------------------------------------------------------------------------------------------------------  RADIOLOGY:  No results found.  EKG:   Orders placed or performed during the hospital encounter of 01/05/16  . ED EKG  . ED EKG  . EKG 12-Lead  . EKG 12-Lead   . EKG 12-Lead  . EKG 12-Lead  . EKG 12-Lead  . EKG 12-Lead    ASSESSMENT AND PLAN:   Bingham Millette is a 82 y.o. male presenting with Tachycardia . Admitted 01/05/2016 : Day #: 7 days 1. Acute respiratory failure with hypoxia. Slowly improving continue oxygen  2. Ventricular tachycardia. Patient on oral amiodarone and metoprolol as per cardiology.  3. Acute systolic congestive heart failure. Off Lasix drip transition to oral nephrology. Continue metoprolol. 4. NSTEMI. Continue aspirin statin and beta blocker. 5. BPH on doxazosin 6. Hyperlipidemia unspecified on atorvastatin  Get physical therapy involvement  All the records are reviewed and case discussed with Care Management/Social Workerr. Management plans discussed with the patient, family and they are in agreement.  CODE STATUS: dnr TOTAL TIME TAKING CARE OF THIS PATIENT: 28 minutes.   POSSIBLE D/C IN 2-3DAYS, DEPENDING ON CLINICAL CONDITION.   Deyonna Fitzsimmons,  Mardi Mainland.D on 01/12/2016 at 11:20 AM  Between 7am to 6pm - Pager - [phone]  After 6pm: House Pager: - [phone]  [facility]  Office  [phone]  CC: Primary care physician; Oswaldo Conroy, MD

## 2016-01-12 NOTE — Care Management Important Message (Signed)
Important Message  Patient Details  Name: Jermaine GuileWillie Fields MRN: [id number] Date of Birth: 10/13/1929   Medicare Important Message Given:  Yes    Phillip Maffei A, RN 01/12/2016, 2:13 PM

## 2016-01-12 NOTE — Progress Notes (Signed)
[facility]  ROUNDING NOTE   Subjective:   Furosemide gtt   UOP 2350 (3875)  Wife at bedside.   Objective:  Vital signs in last 24 hours:  Temp:  [97.8 F (36.6 C)-98.4 F (36.9 C)] 98.3 F (36.8 C) (04/23 0735) Pulse Rate:  [37-88] 80 (04/23 0735) Resp:  [15-20] 20 (04/23 0735) BP: (127-153)/(50-95) 143/59 mmHg (04/23 0735) SpO2:  [92 %-97 %] 94 % (04/23 0735) Weight:  [84.823 kg (187 lb)-89.994 kg (198 lb 6.4 oz)] 84.823 kg (187 lb) (04/23 0935)  Weight change:  Filed Weights   01/11/16 1524 01/12/16 0735 01/12/16 0935  Weight: 89.994 kg (198 lb 6.4 oz) 88.814 kg (195 lb 12.8 oz) 84.823 kg (187 lb)    Intake/Output: I/O last 3 completed shifts: In: -  Out: 4400 [Urine:4400]   Intake/Output this shift:     Physical Exam: General: Sitting up in bed.   Head: Normocephalic, atraumatic. Moist oral mucosal membranes  Eyes: Anicteric, PERRL  Neck: Supple, trachea midline  Lungs:  4 [place] , bilateral crackles.   Heart: Regular rate and rhythm  Abdomen:  Soft, nontender,   Extremities: + peripheral edema.  Neurologic: Nonfocal, moving all four extremities  Skin: No lesions  Access: none    Basic Metabolic Panel:  Recent Labs Lab 01/05/16 1957  01/06/16 1036 01/07/16 0431 01/08/16 0411 01/09/16 0444 01/10/16 0758 01/11/16 0518 01/12/16 0555  NA  --   < > 137 138 136 139 140 141 144  K  --   < > 3.8 3.8 3.6 3.6 3.8 3.4* 3.3*  CL  --   < > 108 108 104 106 107 106 107  CO2  --   < > 22 21* 24 25 26 27 28   GLUCOSE  --   < > 138* 111* 108* 108* 99 103* 103*  BUN  --   < > 32* 42* 48* 60* 59* 60* 57*  CREATININE  --   < > 2.05* 2.33* 2.47* 2.49* 2.24* 2.32* 2.46*  CALCIUM  --   < > 8.8* 8.8* 8.3* 8.7* 8.7* 8.8* 9.0  MG 1.9  --  2.2 2.1  --   --   --   --   --   PHOS  --   --  3.2 3.3  --   --   --   --   --   < > = values in this interval not displayed.  Liver Function Tests:  Recent Labs Lab 01/06/16 1036  AST 35  ALT 38  ALKPHOS 83   BILITOT 0.5  PROT 6.9  ALBUMIN 3.1*   No results for input(s): LIPASE, AMYLASE in the last 168 hours. No results for input(s): AMMONIA in the last 168 hours.  CBC:  Recent Labs Lab 01/06/16 1036 01/07/16 0431 01/08/16 0411 01/09/16 0444 01/11/16 0518  WBC 11.7* 10.7* 9.7 6.9 7.2  NEUTROABS 9.7*  --   --   --   --   HGB 12.0* 11.9* 10.6* 10.6* 10.8*  HCT 36.5* 36.9* 31.9* 32.6* 33.4*  MCV 85.3 86.8 87.8 86.6 85.9  PLT 154 135* 120* 119* 148*    Cardiac Enzymes:  Recent Labs Lab 01/05/16 1650 01/05/16 2327  TROPONINI 2.16* 3.23*    BNP: Invalid input(s): POCBNP  CBG:  Recent Labs Lab 01/05/16 1808  GLUCAP 144*    Microbiology: Results for orders placed or performed during the hospital encounter of 01/05/16  MRSA PCR Screening     Status: None  Collection Time: 01/05/16  6:23 PM  Result Value Ref Range Status   MRSA by PCR NEGATIVE NEGATIVE Final    Comment:        The GeneXpert MRSA Assay (FDA approved for NASAL specimens only), is one component of a comprehensive MRSA colonization surveillance program. It is not intended to diagnose MRSA infection nor to guide or monitor treatment for MRSA infections.     Coagulation Studies: No results for input(s): LABPROT, INR in the last 72 hours.  Urinalysis: No results for input(s): COLORURINE, LABSPEC, PHURINE, GLUCOSEU, HGBUR, BILIRUBINUR, KETONESUR, PROTEINUR, UROBILINOGEN, NITRITE, LEUKOCYTESUR in the last 72 hours.  Invalid input(s): APPERANCEUR    Imaging: No results found.   Medications:     . amiodarone  400 mg Oral BID  . antiseptic oral rinse  7 mL Mouth Rinse BID  . aspirin EC  81 mg Oral Daily  . atorvastatin  40 mg Oral q1800  . doxazosin  2 mg Oral Daily  . feeding supplement (ENSURE ENLIVE)  237 mL Oral BID BM  . furosemide  40 mg Intravenous BID  . heparin subcutaneous  5,000 Units Subcutaneous Q8H  . loratadine  10 mg Oral Daily  . metoprolol succinate  12.5 mg Oral BID   . predniSONE  30 mg Oral Q breakfast  . sodium chloride flush  3 mL Intravenous Q12H   acetaminophen **OR** acetaminophen, hydrALAZINE, ipratropium **AND** levalbuterol, morphine injection, ondansetron **OR** ondansetron (ZOFRAN) IV  Assessment/ Plan:  Jermaine Fields is a 82 y.o.  male  Jermaine Fields is a 82 y.o. black male with hypertension, BPH, allergies, who was admitted to [facility] on 01/05/2016   1. Acute renal failure: unknown baseline creatinine. Acute renal failure from acute cardiorenal syndrome, hypotension and poor renal perfusion. Now with fluid overload and not on diuretics. Medications renally dosed.  - holding lisinopril - Transition from furosemide gtt to PO furosemide today - replace potassium  2. Gross hematuria: also with hemoptysis: thought to be due to foley trauma. However will check ANCA. Pending  3. Acute exacerbation of systolic congestive heart failure with atrial flutter and v-tach:  - furosemide as above.  - pacemaker as per cardiology.   4. Anemia and thrombocytopenia: continue to monitor    LOS: 7 Jermaine Fields 4/23/201710:06 AM

## 2016-01-13 LAB — BASIC METABOLIC PANEL
ANION GAP: 8 (ref 5–15)
BUN: 59 mg/dL — ABNORMAL HIGH (ref 6–20)
CHLORIDE: 107 mmol/L (ref 101–111)
CO2: 30 mmol/L (ref 22–32)
CREATININE: 2.44 mg/dL — AB (ref 0.61–1.24)
Calcium: 9.2 mg/dL (ref 8.9–10.3)
GFR calc non Af Amer: 23 mL/min — ABNORMAL LOW (ref >60)
GFR, EST AFRICAN AMERICAN: 26 mL/min — AB (ref >60)
Glucose, Bld: 117 mg/dL — ABNORMAL HIGH (ref 65–99)
POTASSIUM: 4.1 mmol/L (ref 3.5–5.1)
SODIUM: 145 mmol/L (ref 135–145)

## 2016-01-13 LAB — MPO/PR-3 (ANCA) ANTIBODIES

## 2016-01-13 LAB — CBC
HCT: 32.8 % — ABNORMAL LOW (ref 40.0–52.0)
Hemoglobin: 11 g/dL — ABNORMAL LOW (ref 13.0–18.0)
MCH: 29.1 pg (ref 26.0–34.0)
MCHC: 33.5 g/dL (ref 32.0–36.0)
MCV: 87 fL (ref 80.0–100.0)
PLATELETS: 169 10*3/uL (ref 150–440)
RBC: 3.78 MIL/uL — AB (ref 4.40–5.90)
RDW: 16.2 % — ABNORMAL HIGH (ref 11.5–14.5)
WBC: 9.5 10*3/uL (ref 3.8–10.6)

## 2016-01-13 MED ORDER — ZOLPIDEM TARTRATE 5 MG PO TABS
5.0000 mg | ORAL_TABLET | Freq: Every day | ORAL | Status: DC
  Administered 2016-01-14 (×2): 5 mg via ORAL
  Filled 2016-01-13 (×2): qty 1

## 2016-01-13 NOTE — Clinical Social Work Note (Signed)
Clinical Social Work Assessment  Patient Details  Name: Jermaine Fields MRN: [id number] Date of Birth: 05/28/1930  Date of referral:  01/13/16               Reason for consult:  Facility Placement                Permission sought to share information with:  Family Supports, Oceanographeracility Contact Representative Permission granted to share information::  Yes, Verbal Permission Granted  Name::        Agency::     Relationship::     Contact Information:     Housing/Transportation Living arrangements for the past 2 months:  Single Family Home Source of Information:  Patient, Spouse Patient Interpreter Needed:  None Criminal Activity/Legal Involvement Pertinent to Current Situation/Hospitalization:  No - Comment as needed Significant Relationships:  Spouse Lives with:  Spouse Do you feel safe going back to the place where you live?  Yes Need for family participation in patient care:  Yes (Comment)  Care giving concerns:  Patient lives with his wife of 48 years at home.    Social Worker assessment / plan:  PT has assessed patient and are recommending short term rehab. CSW spoke with patient and his wife this morning regarding this recommendation. Patient's wife believes that it would be best for patient to go to STR so that he can become a little stronger before returning home. Patient and wife are agreeable for short term rehab and they prefer [facility] if possible. CSW to initiate bedsearch today.   Employment status:  Retired Database administratornsurance information:  Managed Medicare PT Recommendations:  Skilled Nursing Facility Information / Referral to community resources:  Skilled Nursing Facility  Patient/Family's Response to care:  Patient and wife expressed appreciation for CSW assistance.  Patient/Family's Understanding of and Emotional Response to Diagnosis, Current Treatment, and Prognosis:  Patient and wife have good insight regarding patient's need for rehab at this time.  Emotional  Assessment Appearance:  Appears stated age Attitude/Demeanor/Rapport:   (pleasant and cooperative) Affect (typically observed):  Accepting, Calm, Adaptable Orientation:  Oriented to Self, Oriented to Place, Oriented to Situation Alcohol / Substance use:  Not Applicable Psych involvement (Current and /or in the community):  No (Comment)  Discharge Needs  Concerns to be addressed:  Care Coordination Readmission within the last 30 days:  No Current discharge risk:  None Barriers to Discharge:  No Barriers Identified   York SpanielMonica Sarh Kirschenbaum, LCSW 01/13/2016, 11:32 AM

## 2016-01-13 NOTE — Progress Notes (Signed)
Informed Dr.Patel that wife is requesting something for husband to sleep. New order:ambien 5mg  at bedtime.

## 2016-01-13 NOTE — Progress Notes (Signed)
PULMONARY / CRITICAL CARE MEDICINE   Name: Jermaine GuileWillie Fields MRN: [id number] DOB: 09/14/1930    ADMISSION DATE:  01/05/2016  BRIEF HISTORY: 82 year old past medical history of hypertension, BPH, presented with near syncopal episode at home as well as chest pain. Arrival to the emergency room noted to be in ventricular tachycardia with rate in 180s, he was given adenosine and metoprolol without effect, he was then electrically cardioverted back into sinus rhythm, noted to have hypoxia in the 80s, placed on nonrebreather mask and brought to ICU for close observation and stabilizing of respiratory status before cardiac catheterization.  SUBJECTIVE:  Doing well today, now weaned down to [place] 2L, tolerated walking today with PT.    VITAL SIGNS: Temp:  [97.5 F (36.4 C)-98.3 F (36.8 C)] 97.5 F (36.4 C) (04/24 1138) Pulse Rate:  [40-93] 93 (04/24 1450) Resp:  [18-20] 18 (04/24 1138) BP: (131-142)/(62-80) 135/67 mmHg (04/24 1138) SpO2:  [84 %-98 %] 84 % (04/24 1450) Weight:  [189 lb 6.4 oz (85.911 kg)-191 lb 8 oz (86.864 kg)] 191 lb 8 oz (86.864 kg) (04/24 1014) HEMODYNAMICS:   VENTILATOR SETTINGS:   INTAKE / OUTPUT:  Intake/Output Summary (Last 24 hours) at 01/13/16 1613 Last data filed at 01/13/16 1329  Gross per 24 hour  Intake    480 ml  Output   1750 ml  Net  -1270 ml    Review of Systems  Constitutional: Negative for chills.  Eyes: Negative for blurred vision.  Respiratory: Positive for shortness of breath. Negative for cough and wheezing.        Improving   Cardiovascular: Negative for chest pain and palpitations.  Gastrointestinal: Negative for heartburn and nausea.  Genitourinary: Negative for dysuria.  Musculoskeletal: Negative for myalgias.  Neurological: Negative for headaches.  Endo/Heme/Allergies: Does not bruise/bleed easily.    Physical Exam  Constitutional: He is oriented to person, place, and time and well-developed, well-nourished, and in no distress.  HENT:   Head: Normocephalic and atraumatic.  Right Ear: External ear normal.  Left Ear: External ear normal.  Eyes: Pupils are equal, round, and reactive to light.  Neck: Neck supple.  Cardiovascular: Normal heart sounds and intact distal pulses.   Pulmonary/Chest: Effort normal. No respiratory distress. He has no wheezes. He has no rales.  Abdominal: Soft. Bowel sounds are normal. He exhibits no distension.  Musculoskeletal: Normal range of motion.  Neurological: He is alert and oriented to person, place, and time.  Skin: Skin is warm and dry.  Nursing note and vitals reviewed.    LABS:  CBC  Recent Labs Lab 01/09/16 0444 01/11/16 0518 01/13/16 0445  WBC 6.9 7.2 9.5  HGB 10.6* 10.8* 11.0*  HCT 32.6* 33.4* 32.8*  PLT 119* 148* 169   Coag's No results for input(s): APTT, INR in the last 168 hours. BMET  Recent Labs Lab 01/11/16 0518 01/12/16 0555 01/13/16 0445  NA 141 144 145  K 3.4* 3.3* 4.1  CL 106 107 107  CO2 27 28 30   BUN 60* 57* 59*  CREATININE 2.32* 2.46* 2.44*  GLUCOSE 103* 103* 117*   Electrolytes  Recent Labs Lab 01/07/16 0431  01/11/16 0518 01/12/16 0555 01/13/16 0445  CALCIUM 8.8*  < > 8.8* 9.0 9.2  MG 2.1  --   --   --   --   PHOS 3.3  --   --   --   --   < > = values in this interval not displayed. Sepsis Markers No results for input(s): LATICACIDVEN,  PROCALCITON, O2SATVEN in the last 168 hours. ABG No results for input(s): PHART, PCO2ART, PO2ART in the last 168 hours. Liver Enzymes No results for input(s): AST, ALT, ALKPHOS, BILITOT, ALBUMIN in the last 168 hours. Cardiac Enzymes No results for input(s): TROPONINI, PROBNP in the last 168 hours. Glucose No results for input(s): GLUCAP in the last 168 hours.  Imaging No results found.  STUDIES:  Echo 04/17>>EF 20-25%, mild LA dilation, moderate RV dilation, 4 chamber dilation with severe LV dysfunction  CULTURES: MRSA screen negative  ANTIBIOTICS: None  SIGNIFICANT  EVENTS: 04/16>ED with dyspnea, near-syncope>SVT>Cardioverted  LINES/TUBES: PIVs  DISCUSSION: 82 YO AA male with acute hypoxic respiratory failure 2/2 volume overload, VT s/p cardioversion now in NSR, chest pain, near syncope and elevated troponin likely acute CHF exacerbation  ASSESSMENT / PLAN:  PULMONARY A: Acute respiratory failure - slowly improving, almost back to baseline Pulmonary edema ?Pneuomonitis (mild) P:  - on HFNC, Fio2 weaned down to 55%, continue weaning as tolerated.  -IV duiresis (gentle)- on hold given inc in Cr  -high risk for intubation/cardiac arrest -overall with good clinical pulmonary improvement -Prednisone  x 5 days total -patient has decided against AICD placement - possbile dc in 1-2 days, may require rehab - primary team will address  CARDIOVASCULAR A:  VT s/p cardioversion Hypertension Elevated troponin Near-syncope P:  -Cardiology following -cont with amio -refused AICD  RENAL A:  AKI  Hypomagnesemia Hypokalemia P:  -Monitor creatinine -avoid nephro toxic drugs.  -Monitor and replace electrolytes  HEMATOLOGIC A:  Anemia Thrombocytopenia P:  -Monitor CBC  ENDOCRINE A:  Mild hyperglycemia without a diagnosis of DM P:  -Monitor blood glucose with BMP  NEUROLOGIC A:  Acute encephalopathy-resolving P:  RASS goal: n/a -Monitor mental status    Thank you for consulting [facility], we will signoff at this time.  Please feel free to contact us with any questions at [phone] (please enter 7-digits).  Pulmonary Care Time devoted to patient care services described in this note is 40 minutes.    Stephanie Acre, MD [facility] Pager [phone] (please enter 7-digits) On Call Pager - [phone] (please enter 7-digits)  Note: This note was prepared with Dragon dictation along with smaller phrase technology. Any transcriptional errors that  result from this process are unintentional.

## 2016-01-13 NOTE — Progress Notes (Signed)
[facility]  ROUNDING NOTE   Subjective:   Doing fair C/o hospital food Being planned and not having enough spices Otherwise, oral intake is fair Urine output 2150 cc Serum creatinine relatively stable at 2.44   Objective:  Vital signs in last 24 hours:  Temp:  [97.5 F (36.4 C)-98.3 F (36.8 C)] 97.5 F (36.4 C) (04/24 1138) Pulse Rate:  [40-93] 93 (04/24 1450) Resp:  [18-20] 18 (04/24 1138) BP: (131-142)/(62-80) 135/67 mmHg (04/24 1138) SpO2:  [84 %-98 %] 84 % (04/24 1450) Weight:  [85.911 kg (189 lb 6.4 oz)-86.864 kg (191 lb 8 oz)] 86.864 kg (191 lb 8 oz) (04/24 1014)  Weight change: -1.179 kg (-2 lb 9.6 oz) Filed Weights   01/12/16 0935 01/13/16 0401 01/13/16 1014  Weight: 84.823 kg (187 lb) 85.911 kg (189 lb 6.4 oz) 86.864 kg (191 lb 8 oz)    Intake/Output: I/O last 3 completed shifts: In: 360 [P.O.:360] Out: 4500 [Urine:4500]   Intake/Output this shift:  Total I/O In: 240 [P.O.:240] Out: 650 [Urine:650]  Physical Exam: General: Sitting up in bed.   Head: Normocephalic, atraumatic. Moist oral mucosal membranes  Eyes: Anicteric,  Neck: Supple, trachea midline  Lungs:  4 Crum , Normal effort, clear anteriorly and laterally   Heart: Regular rate and rhythm  Abdomen:  Soft, nontender,   Extremities: + peripheral edema.  Neurologic: Nonfocal, moving all four extremities  Skin: No lesions  Access: none    Basic Metabolic Panel:  Recent Labs Lab 01/07/16 0431  01/09/16 0444 01/10/16 0758 01/11/16 0518 01/12/16 0555 01/13/16 0445  NA 138  < > 139 140 141 144 145  K 3.8  < > 3.6 3.8 3.4* 3.3* 4.1  CL 108  < > 106 107 106 107 107  CO2 21*  < > GLUCOSE 111*  < > 108* 99 103* 103* 117*  BUN 42*  < > 60* 59* 60* 57* 59*  CREATININE 2.33*  < > 2.49* 2.24* 2.32* 2.46* 2.44*  CALCIUM 8.8*  < > 8.7* 8.7* 8.8* 9.0 9.2  MG 2.1  --   --   --   --   --   --   PHOS 3.3  --   --   --   --   --   --   < > = values in this interval not  displayed.  Liver Function Tests: No results for input(s): AST, ALT, ALKPHOS, BILITOT, PROT, ALBUMIN in the last 168 hours. No results for input(s): LIPASE, AMYLASE in the last 168 hours. No results for input(s): AMMONIA in the last 168 hours.  CBC:  Recent Labs Lab 01/07/16 0431 01/08/16 0411 01/09/16 0444 01/11/16 0518 01/13/16 0445  WBC 10.7* 9.7 6.9 7.2 9.5  HGB 11.9* 10.6* 10.6* 10.8* 11.0*  HCT 36.9* 31.9* 32.6* 33.4* 32.8*  MCV 86.8 87.8 86.6 85.9 87.0  PLT 135* 120* 119* 148* 169    Cardiac Enzymes: No results for input(s): CKTOTAL, CKMB, CKMBINDEX, TROPONINI in the last 168 hours.  BNP: Invalid input(s): POCBNP  CBG: No results for input(s): GLUCAP in the last 168 hours.  Microbiology: Results for orders placed or performed during the hospital encounter of 01/05/16  MRSA PCR Screening     Status: None   Collection Time: 01/05/16  6:23 PM  Result Value Ref Range Status   MRSA by PCR NEGATIVE NEGATIVE Final    Comment:        The GeneXpert MRSA Assay (FDA  approved for NASAL specimens only), is one component of a comprehensive MRSA colonization surveillance program. It is not intended to diagnose MRSA infection nor to guide or monitor treatment for MRSA infections.     Coagulation Studies: No results for input(s): LABPROT, INR in the last 72 hours.  Urinalysis: No results for input(s): COLORURINE, LABSPEC, PHURINE, GLUCOSEU, HGBUR, BILIRUBINUR, KETONESUR, PROTEINUR, UROBILINOGEN, NITRITE, LEUKOCYTESUR in the last 72 hours.  Invalid input(s): APPERANCEUR    Imaging: No results found.   Medications:     . amiodarone  400 mg Oral BID  . antiseptic oral rinse  7 mL Mouth Rinse BID  . aspirin EC  81 mg Oral Daily  . atorvastatin  40 mg Oral q1800  . doxazosin  2 mg Oral Daily  . feeding supplement (ENSURE ENLIVE)  237 mL Oral BID BM  . furosemide  40 mg Intravenous BID  . heparin subcutaneous  5,000 Units Subcutaneous Q8H  . loratadine  10  mg Oral Daily  . metoprolol succinate  12.5 mg Oral BID  . predniSONE  30 mg Oral Q breakfast  . sodium chloride flush  3 mL Intravenous Q12H   acetaminophen **OR** acetaminophen, hydrALAZINE, ipratropium **AND** levalbuterol, morphine injection, ondansetron **OR** ondansetron (ZOFRAN) IV  Assessment/ Plan:  [name] is a 82 y.o.  male with hypertension, BPH, allergies, who was [facility] on 01/05/2016    1. Acute renal failure: unknown baseline creatinine. Acute renal failure from acute cardiorenal syndrome, hypotension and poor renal perfusion. Now with fluid overload and not on diuretics. Medications renally dosed.  - Serum creatinine 2.44, urine output 2150    2. Gross hematuria: also with hemoptysis: thought to be due to foley trauma. However will check ANCA. Pending Repeat urinalysis. Urine is clearing up now..  3. Acute exacerbation of systolic congestive heart failure with atrial flutter and v-tach:  - furosemide  40 mg IV twice a day.  - pacemaker as per cardiology.  - Consider changing Lasix to 40 mg by mouth twice a day tomorrow morning    LOS: 8 Jermaine Fields 4/24/20173:35 PM

## 2016-01-13 NOTE — [place] FL2 (Signed)
[place] MEDICAID FL2 LEVEL OF CARE SCREENING TOOL     IDENTIFICATION  Patient Name: Jermaine Fields Birthdate: 1930/06/15 Sex: male Admission Date (Current Location): 01/05/2016  [place] Number:  Chiropodist and Address:  [facility], [address], [place], [place] [address]      Provider Number: [id number]  Attending Physician Name and Address:  Wyatt Haste, MD  Relative Name and Phone Number:       Current Level of Care: Hospital Recommended Level of Care: Skilled Nursing Facility Prior Approval Number:    Date Approved/Denied:   PASRR Number:    Discharge Plan: SNF    Current Diagnoses: Patient Active Problem List   Diagnosis Date Noted  . Acute on chronic renal insufficiency (HCC) 01/10/2016  . Acute respiratory failure (HCC) 01/06/2016  . Acute systolic CHF (congestive heart failure) (HCC) 01/06/2016  . NSTEMI (non-ST elevated myocardial infarction) (HCC) 01/06/2016  . Ventricular tachycardia (HCC) 01/05/2016    Orientation RESPIRATION BLADDER Height & Weight     Self, Time, Situation, Place  Normal, O2 (3 liters O2) Continent Weight: 191 lb 8 oz (86.864 kg) Height:   (180.3 cm)  BEHAVIORAL SYMPTOMS/MOOD NEUROLOGICAL BOWEL NUTRITION STATUS   (none)  (none) Continent Diet (dysphagia 3)  AMBULATORY STATUS COMMUNICATION OF NEEDS Skin   Extensive Assist Verbally Normal                       Personal Care Assistance Level of Assistance  Dressing, Bathing Bathing Assistance: Limited assistance   Dressing Assistance: Limited assistance     Functional Limitations Info   (none)          SPECIAL CARE FACTORS FREQUENCY  PT (By licensed PT)                    Contractures Contractures Info: Not present    Additional Factors Info  Code Status, Allergies Code Status Info: DNR Allergies Info: vit D2           Current Medications (01/13/2016):  This is the current hospital active  medication list Current Facility-Administered Medications  Medication Dose Route Frequency Provider Last Rate Last Dose  . acetaminophen (TYLENOL) tablet 650 mg  650 mg Oral Q6H PRN Auburn Bilberry, MD       Or  . acetaminophen (TYLENOL) suppository 650 mg  650 mg Rectal Q6H PRN Auburn Bilberry, MD      . amiodarone (PACERONE) tablet 400 mg  400 mg Oral BID Duke Salvia, MD   400 mg at 01/13/16 0865  . antiseptic oral rinse (CPC / CETYLPYRIDINIUM CHLORIDE 0.05%) solution 7 mL  7 mL Mouth Rinse BID Auburn Bilberry, MD   7 mL at 01/12/16 2200  . aspirin EC tablet 81 mg  81 mg Oral Daily Duke Salvia, MD   81 mg at 01/13/16 7846  . atorvastatin (LIPITOR) tablet 40 mg  40 mg Oral q1800 Shaune Pollack, MD   40 mg at 01/12/16 1732  . doxazosin (CARDURA) tablet 2 mg  2 mg Oral Daily Shaune Pollack, MD   2 mg at 01/13/16 [phone]  . feeding supplement (ENSURE ENLIVE) (ENSURE ENLIVE) liquid 237 mL  237 mL Oral BID BM Wyatt Haste, MD   237 mL at 01/13/16 1400  . furosemide (LASIX) injection 40 mg  40 mg Intravenous BID Lamont Dowdy, MD   40 mg at 01/13/16 0938  . heparin injection 5,000 Units  5,000 Units Subcutaneous Q8H Vishal Mungal, MD   5,000 Units at 01/13/16 0600  . hydrALAZINE (APRESOLINE) injection 10 mg  10 mg Intravenous Q4H PRN Lewie LoronMagadalene S Tukov, NP   10 mg at 01/06/16 2348  . ipratropium (ATROVENT) nebulizer solution 0.5 mg  0.5 mg Nebulization Q6H PRN Vishal Mungal, MD       And  . levalbuterol (XOPENEX) nebulizer solution 1.25 mg  1.25 mg Nebulization Q6H PRN Vishal Mungal, MD   1.25 mg at 01/13/16 1138  . loratadine (CLARITIN) tablet 10 mg  10 mg Oral Daily Shaune PollackQing Chen, MD   10 mg at 01/13/16 [phone]  . metoprolol succinate (TOPROL-XL) 24 hr tablet 12.5 mg  12.5 mg Oral BID Delfino LovettVipul Shah, MD   12.5 mg at 01/13/16 [phone]  . morphine 2 MG/ML injection 2 mg  2 mg Intravenous Q1H PRN Erin FullingKurian Kasa, MD   2 mg at 01/07/16 1434  . ondansetron (ZOFRAN) tablet 4 mg  4 mg Oral Q6H PRN Auburn BilberryShreyang Patel, MD       Or  .  ondansetron (ZOFRAN) injection 4 mg  4 mg Intravenous Q6H PRN Auburn BilberryShreyang Patel, MD      . predniSONE (DELTASONE) tablet 30 mg  30 mg Oral Q breakfast Vishal Mungal, MD   30 mg at 01/13/16 0938  . sodium chloride flush (NS) 0.9 % injection 3 mL  3 mL Intravenous Q12H Auburn BilberryShreyang Patel, MD   3 mL at 01/13/16 [phone]     Discharge Medications: Please see discharge summary for a list of discharge medications.  Relevant Imaging Results:  Relevant Lab Results:   Additional Information SS: [id number]  York SpanielMonica Jeanene Mena, LCSW

## 2016-01-13 NOTE — Progress Notes (Signed)
[facility] [place] [facility]   PATIENT NAME: Jermaine GuileWillie Fields    MRN#:  [id number]  DATE OF BIRTH:  01/14/1930  SUBJECTIVE:  Hospital Day: 8 days Jermaine Fields is a 82 y.o. male presenting with Tachycardia .   Overnight events: No overnight events Interval Events: Complains of some weakness particularly with activity, otherwise no complaints, wife at bedside  REVIEW OF SYSTEMS:  CONSTITUTIONAL: No fever, fatigue or weakness.  EYES: No blurred or double vision.  EARS, NOSE, AND THROAT: No tinnitus or ear pain.  RESPIRATORY: No cough, shortness of breath, wheezing or hemoptysis.  CARDIOVASCULAR: No chest pain, orthopnea, edema.  GASTROINTESTINAL: No nausea, vomiting, diarrhea or abdominal pain.  GENITOURINARY: No dysuria, hematuria.  ENDOCRINE: No polyuria, nocturia,  HEMATOLOGY: No anemia, easy bruising or bleeding SKIN: No rash or lesion. MUSCULOSKELETAL: No joint pain or arthritis.   NEUROLOGIC: No tingling, numbness, weakness.  PSYCHIATRY: No anxiety or depression.   DRUG ALLERGIES:   Allergies  Allergen Reactions  . Vitamin D2 [Ergocalciferol] Other (See Comments)    This medication made the patient too hot.    VITALS:  Blood pressure 135/67, pulse 73, temperature 97.5 F (36.4 C), temperature source Oral, resp. rate 18, height 5\' 11"  (1.803 m), weight 86.864 kg (191 lb 8 oz), SpO2 98 %.  PHYSICAL EXAMINATION:  VITAL SIGNS: Filed Vitals:   01/13/16 0735 01/13/16 1138  BP: 142/80 135/67  Pulse: 77 73  Temp:  97.5 F (36.4 C)  Resp: 20 18   GENERAL:82 y.o.male currently in No acute distress.  HEAD: Normocephalic, atraumatic.  EYES: Pupils equal, round, reactive to light. Extraocular muscles intact. No scleral icterus.  MOUTH: Moist mucosal membrane. Dentition intact. No abscess noted.  EAR, NOSE, THROAT: Clear without exudates. No external lesions.  NECK: Supple. No thyromegaly. No nodules. No JVD.  PULMONARY: Improving Coarse  breath sounds without wheeze rails or rhonci. No use of accessory muscles, Good respiratory effort. good air entry bilaterally CHEST: Nontender to palpation.  CARDIOVASCULAR: S1 and S2. Regular rate and rhythm. No murmurs, rubs, or gallops. Trace edema. Pedal pulses 2+ bilaterally.  GASTROINTESTINAL: Soft, nontender, nondistended. No masses. Positive bowel sounds. No hepatosplenomegaly.  MUSCULOSKELETAL: No swelling, clubbing, or edema. Range of motion full in all extremities.  NEUROLOGIC: Cranial nerves II through XII are intact. No gross focal neurological deficits. Sensation intact. Reflexes intact.  SKIN: No ulceration, lesions, rashes, or cyanosis. Skin warm and dry. Turgor intact.  PSYCHIATRIC: Mood, affect within normal limits. The patient is awake, alert and oriented x 3. Insight, judgment intact.      LABORATORY PANEL:   CBC  Recent Labs Lab 01/13/16 0445  WBC 9.5  HGB 11.0*  HCT 32.8*  PLT 169   ------------------------------------------------------------------------------------------------------------------  Chemistries   Recent Labs Lab 01/07/16 0431  01/13/16 0445  NA 138  < > 145  K 3.8  < > 4.1  CL 108  < > 107  CO2 21*  < > 30  GLUCOSE 111*  < > 117*  BUN 42*  < > 59*  CREATININE 2.33*  < > 2.44*  CALCIUM 8.8*  < > 9.2  MG 2.1  --   --   < > = values in this interval not displayed. ------------------------------------------------------------------------------------------------------------------  Cardiac Enzymes No results for input(s): TROPONINI in the last 168 hours. ------------------------------------------------------------------------------------------------------------------  RADIOLOGY:  No results found.  EKG:   Orders placed or performed during the hospital encounter of 01/05/16  . ED EKG  .  ED EKG  . EKG 12-Lead  . EKG 12-Lead  . EKG 12-Lead  . EKG 12-Lead  . EKG 12-Lead  . EKG 12-Lead    ASSESSMENT AND PLAN:   Jermaine Fields  is a 82 y.o. male presenting with Tachycardia . Admitted 01/05/2016 : Day #: 8 days 1. Acute respiratory failure with hypoxia. Slowly improving continue oxygen  2. Ventricular tachycardia. Patient on oral amiodarone and metoprolol as per cardiology.  3. Acute systolic congestive heart failure. Currently Lasix 40 mg daily-question on whether to continue on discharge Continue metoprolol. 4. NSTEMI. Continue aspirin statin and beta blocker. 5. BPH on doxazosin 6. Hyperlipidemia unspecified on atorvastatin  Disposition SNF  All the records are reviewed and case discussed with Care Management/Social Workerr. Management plans discussed with the patient, family and they are in agreement.  CODE STATUS: dnr TOTAL TIME TAKING CARE OF THIS PATIENT: 28 minutes.   POSSIBLE D/C IN 1-2DAYS, DEPENDING ON CLINICAL CONDITION.   Lurene Robley,  Mardi Mainland.D on 01/13/2016 at 12:22 PM  Between 7am to 6pm - Pager - [phone]  After 6pm: House Pager: - [phone]  [facility]  Office  [phone]  CC: Primary care physician; Oswaldo Conroy, MD

## 2016-01-13 NOTE — Progress Notes (Signed)
Physical Therapy Treatment Patient Details Name: Jermaine Fields MRN: [id number] DOB: October 30, 1929 Today's Date: 01/13/2016    History of Present Illness 82 yo male with onset of tachycardia, has NSTEMI, CHF and recent respiratory failure.  PMHx:  HLD, CHF    PT Comments    Pt able to progress to ambulation 30 feet with RW min to mod assist x2.  Pt required increased assist with functional mobility d/t frequent loss of balance more to R than to L and posterior.  Activity limited d/t fatigue.  Pt's O2 decreased from 92% at rest to 84% post ambulation but returned to 92% within a minute of sitting rest break (all on 2 L/min O2 via nasal cannula; nursing notified of O2 sats during session).  Will continue to progress pt with ambulation distance, strengthening, and balance per pt tolerance.   Follow Up Recommendations  SNF     Equipment Recommendations  Rolling walker with 5" wheels    Recommendations for Other Services       Precautions / Restrictions Precautions Precautions: Fall Precaution Comments: Telemetry Restrictions Weight Bearing Restrictions: No    Mobility  Bed Mobility Overal bed mobility: Needs Assistance Bed Mobility: Supine to Sit;Sit to Supine     Supine to sit: Supervision;HOB elevated Sit to supine: Mod assist;HOB elevated   General bed mobility comments: 2 assist to scoot pt up in bed; increased effort to perform by pt  Transfers Overall transfer level: Needs assistance Equipment used: Rolling walker (2 wheeled) Transfers: Sit to/from Stand Sit to Stand: Min assist         General transfer comment: vc's for hand placement required; increased effort noted by pt; x3 repetitions  Ambulation/Gait Ambulation/Gait assistance: Min assist;Mod assist;+2 physical assistance Ambulation Distance (Feet): 30 Feet Assistive device: Rolling walker (2 wheeled)   Gait velocity: decreased   General Gait Details: decreased B step length/foot clearance/heelstrike;  unsteady with frequent loss of balance (more to R than to L and posterior); vc's required for walker use and navigation   Stairs            Wheelchair Mobility    Modified Rankin (Stroke Patients Only)       Balance Overall balance assessment: Needs assistance Sitting-balance support: Bilateral upper extremity supported;Feet supported Sitting balance-Leahy Scale: Fair     Standing balance support: Bilateral upper extremity supported (on RW) Standing balance-Leahy Scale: Poor Standing balance comment: R/L/posterior loss of balance with marching in place and ambulation                    Cognition Arousal/Alertness: Awake/alert Behavior During Therapy: Flat affect Overall Cognitive Status: Within Functional Limits for tasks assessed       Memory: Decreased recall of precautions              Exercises      General Comments   Nursing cleared pt for participation in physical therapy.  Pt agreeable to PT session. Pt's wife present during session.      Pertinent Vitals/Pain Pain Assessment: No/denies pain    Home Living                      Prior Function            PT Goals (current goals can now be found in the care plan section) Acute Rehab PT Goals Patient Stated Goal: to get up to walk again PT Goal Formulation: With patient/family Time For Goal Achievement: 01/26/16 Potential to Achieve  Goals: Good Progress towards PT goals: Progressing toward goals    Frequency  Min 2X/week    PT Plan Current plan remains appropriate    Co-evaluation             End of Session Equipment Utilized During Treatment: Oxygen Activity Tolerance: Patient limited by fatigue Patient left: in bed;with call bell/phone within reach;with bed alarm set;with family/visitor present     Time: 1433-1500 PT Time Calculation (min) (ACUTE ONLY): 27 min  Charges:  $Gait Training: 8-22 mins $Therapeutic Activity: 8-22 mins                    G  CodesHendricks Fields:      Jermaine Fields 01/13/2016, 3:36 PM Jermaine LimesEmily Mariette Fields, PT [phone]

## 2016-01-14 LAB — URINALYSIS COMPLETE WITH MICROSCOPIC (ARMC ONLY)
BILIRUBIN URINE: NEGATIVE
GLUCOSE, UA: NEGATIVE mg/dL
Ketones, ur: NEGATIVE mg/dL
Nitrite: NEGATIVE
PH: 6 (ref 5.0–8.0)
Protein, ur: 30 mg/dL — AB
SPECIFIC GRAVITY, URINE: 1.013 (ref 1.005–1.030)

## 2016-01-14 MED ORDER — AMLODIPINE BESYLATE 10 MG PO TABS
10.0000 mg | ORAL_TABLET | Freq: Every day | ORAL | Status: DC
  Administered 2016-01-14 – 2016-01-15 (×2): 10 mg via ORAL
  Filled 2016-01-14 (×2): qty 1

## 2016-01-14 MED ORDER — FUROSEMIDE 40 MG PO TABS
40.0000 mg | ORAL_TABLET | Freq: Two times a day (BID) | ORAL | Status: DC
  Administered 2016-01-14 – 2016-01-15 (×3): 40 mg via ORAL
  Filled 2016-01-14 (×3): qty 1

## 2016-01-14 MED ORDER — DEXTROSE 5 % IV SOLN
1.0000 g | Freq: Every day | INTRAVENOUS | Status: DC
  Administered 2016-01-14 – 2016-01-15 (×2): 1 g via INTRAVENOUS
  Filled 2016-01-14 (×3): qty 10

## 2016-01-14 NOTE — Clinical Social Work Note (Signed)
Patient and wife have received bed offer of preference, [facility]. CSW will facilitate discharge to [facility] when time. York SpanielMonica Tera Pellicane MSW,LCSW [phone]

## 2016-01-14 NOTE — Progress Notes (Signed)
Patient presently resting in the bed, remains alert and oriented, denies any pain, family at bedside, patient remains NSR with PVC, denies pain or discomfort at this time. Will continue to monitor

## 2016-01-14 NOTE — Progress Notes (Signed)
[facility] - [place] at [facility]   PATIENT NAME: Jermaine Fields    MRN#:  [id number]  DATE OF BIRTH:  [birth year]  SUBJECTIVE:  Hospital Day: 9 days Jermaine Fields is a 82 y.o. male presenting with Tachycardia .   Overnight events: No overnight events Interval Events: Had intermittent dysuria after Foley removal  REVIEW OF SYSTEMS:  CONSTITUTIONAL: No fever, fatigue or weakness.  EYES: No blurred or double vision.  EARS, NOSE, AND THROAT: No tinnitus or ear pain.  RESPIRATORY: No cough, shortness of breath, wheezing or hemoptysis.  CARDIOVASCULAR: No chest pain, orthopnea, edema.  GASTROINTESTINAL: No nausea, vomiting, diarrhea or abdominal pain.  GENITOURINARY: positive dysuria, no hematuria.  ENDOCRINE: No polyuria, nocturia,  HEMATOLOGY: No anemia, easy bruising or bleeding SKIN: No rash or lesion. MUSCULOSKELETAL: No joint pain or arthritis.   NEUROLOGIC: No tingling, numbness, weakness.  PSYCHIATRY: No anxiety or depression.   DRUG ALLERGIES:   Allergies  Allergen Reactions  . Vitamin D2 [Ergocalciferol] Other (See Comments)    This medication made the patient too hot.    VITALS:  Blood pressure 159/72, pulse 81, temperature 98.1 F (36.7 C), temperature source Oral, resp. rate 16, height  (1.803 m), weight 86.864 kg (191 lb 8 oz), SpO2 94 %.  PHYSICAL EXAMINATION:  VITAL SIGNS: Filed Vitals:   01/13/16 1953 01/14/16 0422  BP: 151/46 159/72  Pulse: 41 81  Temp: 98.3 F (36.8 C) 98.1 F (36.7 C)  Resp: 16 16   GENERAL:82 y.o.male currently in No acute distress.  HEAD: Normocephalic, atraumatic.  EYES: Pupils equal, round, reactive to light. Extraocular muscles intact. No scleral icterus.  MOUTH: Moist mucosal membrane. Dentition intact. No abscess noted.  EAR, NOSE, THROAT: Clear without exudates. No external lesions.  NECK: Supple. No thyromegaly. No nodules. No JVD.  PULMONARY: Improving Coarse breath sounds without wheeze  rails or rhonci. No use of accessory muscles, Good respiratory effort. good air entry bilaterally CHEST: Nontender to palpation.  CARDIOVASCULAR: S1 and S2. Regular rate and rhythm. No murmurs, rubs, or gallops. Trace edema. Pedal pulses 2+ bilaterally.  GASTROINTESTINAL: Soft, nontender, nondistended. No masses. Positive bowel sounds. No hepatosplenomegaly.  MUSCULOSKELETAL: No swelling, clubbing, or edema. Range of motion full in all extremities.  NEUROLOGIC: Cranial nerves II through XII are intact. No gross focal neurological deficits. Sensation intact. Reflexes intact.  SKIN: No ulceration, lesions, rashes, or cyanosis. Skin warm and dry. Turgor intact.  PSYCHIATRIC: Mood, affect within normal limits. The patient is awake, alert and oriented x 3. Insight, judgment intact.      LABORATORY PANEL:   CBC  Recent Labs Lab 01/13/16 0445  WBC 9.5  HGB 11.0*  HCT 32.8*  PLT 169   ------------------------------------------------------------------------------------------------------------------  Chemistries   Recent Labs Lab 01/13/16 0445  NA 145  K 4.1  CL 107  CO2 30  GLUCOSE 117*  BUN 59*  CREATININE 2.44*  CALCIUM 9.2   ------------------------------------------------------------------------------------------------------------------  Cardiac Enzymes No results for input(s): TROPONINI in the last 168 hours. ------------------------------------------------------------------------------------------------------------------  RADIOLOGY:  No results found.  EKG:   Orders placed or performed during the hospital encounter of 01/05/16  . ED EKG  . ED EKG  . EKG 12-Lead  . EKG 12-Lead  . EKG 12-Lead  . EKG 12-Lead  . EKG 12-Lead  . EKG 12-Lead    ASSESSMENT AND PLAN:   Jermaine Fields is a 82 y.o. male presenting with Tachycardia . Admitted 01/05/2016 : Day #: 9 days  1. UTI:  Site unspecified, ceftriaxone urine culture 1. Acute respiratory failure with hypoxia.  Slowly improving continue oxygen  2. Ventricular tachycardia. Patient on oral amiodarone and metoprolol as per cardiology.  3. Acute systolic congestive heart failure. Transition to oral lasix 4. NSTEMI. Continue aspirin statin and beta blocker. 5. BPH on doxazosin 6. Hyperlipidemia unspecified on atorvastatin  Disposition SNF  All the records are reviewed and case discussed with Care Management/Social Workerr. Management plans discussed with the patient, family and they are in agreement.  CODE STATUS: dnr TOTAL TIME TAKING CARE OF THIS PATIENT: 28 minutes.   POSSIBLE D/C IN 1-2DAYS, DEPENDING ON CLINICAL CONDITION.   Hower,  Mardi MainlandDavid K M.D on 01/14/2016 at 1:17 PM  Between 7am to 6pm - Pager - [phone]  After 6pm: House Pager: - [phone]  [facility]  Office  [phone]  CC: Primary care physician; Oswaldo ConroyBender, Abby Daneele, MD

## 2016-01-14 NOTE — Progress Notes (Signed)
Patient Name: Jermaine Fields      SUBJECTIVE:**denies chest pain; breathing stable walked yesterday  Now on O2 cannula Alert and responsive  Past Medical History  Diagnosis Date  . Hypertension   . BPH (benign prostatic hyperplasia)   . Environmental allergies     Scheduled Meds:  Scheduled Meds: . amiodarone  400 mg Oral BID  . antiseptic oral rinse  7 mL Mouth Rinse BID  . aspirin EC  81 mg Oral Daily  . atorvastatin  40 mg Oral q1800  . cefTRIAXone (ROCEPHIN)  IV  1 g Intravenous Daily  . doxazosin  2 mg Oral Daily  . feeding supplement (ENSURE ENLIVE)  237 mL Oral BID BM  . furosemide  40 mg Oral BID  . heparin subcutaneous  5,000 Units Subcutaneous Q8H  . loratadine  10 mg Oral Daily  . metoprolol succinate  12.5 mg Oral BID  . sodium chloride flush  3 mL Intravenous Q12H  . zolpidem  5 mg Oral QHS   Continuous Infusions:  acetaminophen **OR** acetaminophen, hydrALAZINE, ipratropium **AND** levalbuterol, morphine injection, ondansetron **OR** ondansetron (ZOFRAN) IV    PHYSICAL EXAM Filed Vitals:   01/13/16 1500 01/13/16 1953 01/14/16 0422 01/14/16 0530  BP:  151/46 159/72   Pulse:  41 81   Temp:  98.3 F (36.8 C) 98.1 F (36.7 C)   TempSrc:  Oral Oral   Resp:  16 16   Height:      Weight:      SpO2: 92% 96% 92% 94%   Well developed and nourished in mod resp distress wearing high flow O2 HENT normal Neck supple   Good air movt laterally Irregular rate and rhythm,  Abd-soft with active BS No Clubbing cyanosis   Skin-warm and dry A & Oriented  Grossly normal sensory and motor function   TELEMETRY: Reviewed telemetry pt in sinus with freq PVCs couplets  With decrease in burden over last 24 hrs dfrom 62min>>15-20/min    Intake/Output Summary (Last 24 hours) at 01/14/16 1237 Last data filed at 01/14/16 1025  Gross per 24 hour  Intake    360 ml  Output   1100 ml  Net   -740 ml    LABS: Basic Metabolic Panel:  Recent Labs Lab  01/08/16 0411 01/09/16 0444 01/10/16 0758 01/11/16 0518 01/12/16 0555 01/13/16 0445  NA 136 139 140 141 144 145  K 3.6 3.6 3.8 3.4* 3.3* 4.1  CL 104 106 107 106 107 107  CO2 GLUCOSE 108* 108* 99 103* 103* 117*  BUN 48* 60* 59* 60* 57* 59*  CREATININE 2.47* 2.49* 2.24* 2.32* 2.46* 2.44*  CALCIUM 8.3* 8.7* 8.7* 8.8* 9.0 9.2   Cardiac Enzymes: No results for input(s): CKTOTAL, CKMB, CKMBINDEX, TROPONINI in the last 72 hours. CBC:  Recent Labs Lab 01/08/16 0411 01/09/16 0444 01/11/16 0518 01/13/16 0445  WBC 9.7 6.9 7.2 9.5  HGB 10.6* 10.6* 10.8* 11.0*  HCT 31.9* 32.6* 33.4* 32.8*  MCV 87.8 86.6 85.9 87.0  PLT 120* 119* 148* 169   PROTIME: No results for input(s): LABPROT, INR in the last 72 hours. Liver Function Tests: No results for input(s): AST, ALT, ALKPHOS, BILITOT, PROT, ALBUMIN in the last 72 hours. No results for input(s): LIPASE, AMYLASE in the last 72 hours. BNP: BNP (last 3 results)  Recent Labs  01/05/16 0920  BNP 1339.0*    ProBNP (last 3 results) No results for input(s): PROBNP  in the last 8760 hours.  D-Dimer:   ASSESSMENT AND PLAN:  Principal Problem:   Ventricular tachycardia (HCC) Active Problems:   Acute respiratory failure (HCC)   Acute systolic CHF (congestive heart failure) (HCC)   NSTEMI (non-ST elevated myocardial infarction) (HCC)   Acute on chronic renal insufficiency (HCC)  The patient and the family have elected not to pursue ICD therapy. I Also with renal insufficiency has been decided to go catheterization. Risk stratification with a Myoview scan might be of value.  There is a decrease in the PVC burden over the last 24-48 hours.  I would continue the amiodarone at 400 mg twice daily for one more week then decrease it to 200 mg twice daily for 2 weeks and then continue long-term therapy at 200 mg a week.  Please call if we can be of any further assistance Signed, Sherryl MangesSteven Aleana Fifita MD  01/14/2016

## 2016-01-14 NOTE — Care Management Important Message (Signed)
Important Message  Patient Details  Name: Jermaine Fields MRN: [id number] Date of Birth: 05/21/1930   Medicare Important Message Given:  Yes    Olegario MessierKathy A Calaya Gildner 01/14/2016, 11:21 AM

## 2016-01-14 NOTE — Progress Notes (Signed)
[facility]  ROUNDING NOTE   Subjective:   Doing fair No acute complaints. Urine output 1650 cc Serum creatinine relatively stable at 2.44   Objective:  Vital signs in last 24 hours:  Temp:  [98.1 F (36.7 C)-98.3 F (36.8 C)] 98.1 F (36.7 C) (04/25 0422) Pulse Rate:  [41-81] 81 (04/25 0422) Resp:  [16] 16 (04/25 0422) BP: (151-159)/(46-72) 159/72 mmHg (04/25 0422) SpO2:  [92 %-96 %] 94 % (04/25 0530)  Weight change: -1.95 kg (-4 lb 4.8 oz) Filed Weights   01/12/16 0935 01/13/16 0401 01/13/16 1014  Weight: 84.823 kg (187 lb) 85.911 kg (189 lb 6.4 oz) 86.864 kg (191 lb 8 oz)    Intake/Output: I/O last 3 completed shifts: In: 480 [P.O.:480] Out: 2750 [Urine:2750]   Intake/Output this shift:  Total I/O In: 120 [P.O.:120] Out: 750 [Urine:750]  Physical Exam: General: Sitting up in bed.   Head: Normocephalic, atraumatic. Moist oral mucosal membranes  Eyes: Anicteric,  Neck: Supple, trachea midline  Lungs:  4 [place] , Normal effort, clear anteriorly and laterally   Heart: Regular rate and rhythm  Abdomen:  Soft, nontender,   Extremities: + peripheral edema.  Neurologic: Nonfocal, moving all four extremities  Skin: No lesions  Access: none    Basic Metabolic Panel:  Recent Labs Lab 01/09/16 0444 01/10/16 0758 01/11/16 0518 01/12/16 0555 01/13/16 0445  NA 139 140 141 144 145  K 3.6 3.8 3.4* 3.3* 4.1  CL 106 107 106 107 107  CO2 GLUCOSE 108* 99 103* 103* 117*  BUN 60* 59* 60* 57* 59*  CREATININE 2.49* 2.24* 2.32* 2.46* 2.44*  CALCIUM 8.7* 8.7* 8.8* 9.0 9.2    Liver Function Tests: No results for input(s): AST, ALT, ALKPHOS, BILITOT, PROT, ALBUMIN in the last 168 hours. No results for input(s): LIPASE, AMYLASE in the last 168 hours. No results for input(s): AMMONIA in the last 168 hours.  CBC:  Recent Labs Lab 01/08/16 0411 01/09/16 0444 01/11/16 0518 01/13/16 0445  WBC 9.7 6.9 7.2 9.5  HGB 10.6* 10.6* 10.8* 11.0*   HCT 31.9* 32.6* 33.4* 32.8*  MCV 87.8 86.6 85.9 87.0  PLT 120* 119* 148* 169    Cardiac Enzymes: No results for input(s): CKTOTAL, CKMB, CKMBINDEX, TROPONINI in the last 168 hours.  BNP: Invalid input(s): POCBNP  CBG: No results for input(s): GLUCAP in the last 168 hours.  Microbiology: Results for orders placed or performed during the hospital encounter of 01/05/16  MRSA PCR Screening     Status: None   Collection Time: 01/05/16  6:23 PM  Result Value Ref Range Status   MRSA by PCR NEGATIVE NEGATIVE Final    Comment:        The GeneXpert MRSA Assay (FDA approved for NASAL specimens only), is one component of a comprehensive MRSA colonization surveillance program. It is not intended to diagnose MRSA infection nor to guide or monitor treatment for MRSA infections.     Coagulation Studies: No results for input(s): LABPROT, INR in the last 72 hours.  Urinalysis:  Recent Labs  01/14/16 0638  COLORURINE YELLOW*  LABSPEC 1.013  PHURINE 6.0  GLUCOSEU NEGATIVE  HGBUR 1+*  BILIRUBINUR NEGATIVE  KETONESUR NEGATIVE  PROTEINUR 30*  NITRITE NEGATIVE  LEUKOCYTESUR 3+*      Imaging: No results found.   Medications:     . amiodarone  400 mg Oral BID  . amLODipine  10 mg Oral Daily  . antiseptic oral rinse  7  mL Mouth Rinse BID  . aspirin EC  81 mg Oral Daily  . atorvastatin  40 mg Oral q1800  . cefTRIAXone (ROCEPHIN)  IV  1 g Intravenous Daily  . doxazosin  2 mg Oral Daily  . feeding supplement (ENSURE ENLIVE)  237 mL Oral BID BM  . furosemide  40 mg Oral BID  . heparin subcutaneous  5,000 Units Subcutaneous Q8H  . loratadine  10 mg Oral Daily  . metoprolol succinate  12.5 mg Oral BID  . sodium chloride flush  3 mL Intravenous Q12H  . zolpidem  5 mg Oral QHS   acetaminophen **OR** acetaminophen, hydrALAZINE, ipratropium **AND** levalbuterol, morphine injection, ondansetron **OR** ondansetron (ZOFRAN) IV  Assessment/ Plan:  [name] is a 82  y.o.  male with hypertension, BPH, allergies, who was [facility] to [facility]/05/[facility]    1. Acute renal failure: unknown baseline creatinine. Acute renal failure from acute cardiorenal syndrome, hypotension and poor renal perfusion. Now with fluid overload and not on diuretics. Medications renally dosed.  - Serum creatinine 2.44, urine output greater than 1600   2. Gross hematuria: also with hemoptysis: thought to be due to foley trauma. ANCA negative His repeat urinalysis shows 3+ leukocytes and WBCs consistent with UTI Antibiotics as per primary team  3. Acute exacerbation of systolic congestive heart failure with atrial flutter and v-tach:  - Lasix to 40 mg by mouth twice a day    LOS: 9 Ambrie Carte 4/25/20173:43 PM

## 2016-01-15 LAB — BASIC METABOLIC PANEL
ANION GAP: 11 (ref 5–15)
BUN: 51 mg/dL — ABNORMAL HIGH (ref 6–20)
CALCIUM: 9.1 mg/dL (ref 8.9–10.3)
CO2: 31 mmol/L (ref 22–32)
CREATININE: 2.34 mg/dL — AB (ref 0.61–1.24)
Chloride: 103 mmol/L (ref 101–111)
GFR, EST AFRICAN AMERICAN: 28 mL/min — AB (ref >60)
GFR, EST NON AFRICAN AMERICAN: 24 mL/min — AB (ref >60)
Glucose, Bld: 105 mg/dL — ABNORMAL HIGH (ref 65–99)
Potassium: 4.2 mmol/L (ref 3.5–5.1)
Sodium: 145 mmol/L (ref 135–145)

## 2016-01-15 MED ORDER — CEFUROXIME AXETIL 500 MG PO TABS: 500.0000 mg | ORAL_TABLET | Freq: Two times a day (BID) | ORAL | Status: AC

## 2016-01-15 MED ORDER — LEVALBUTEROL HCL 1.25 MG/0.5ML IN NEBU: 1.2500 mg | INHALATION_SOLUTION | Freq: Four times a day (QID) | RESPIRATORY_TRACT | Status: AC | PRN

## 2016-01-15 MED ORDER — ATORVASTATIN CALCIUM 40 MG PO TABS: 40.0000 mg | ORAL_TABLET | Freq: Every day | ORAL | Status: AC

## 2016-01-15 MED ORDER — AMIODARONE HCL 400 MG PO TABS: 400.0000 mg | ORAL_TABLET | Freq: Two times a day (BID) | ORAL | Status: AC

## 2016-01-15 MED ORDER — ZOLPIDEM TARTRATE 5 MG PO TABS: 5.0000 mg | ORAL_TABLET | Freq: Every day | ORAL | Status: AC

## 2016-01-15 MED ORDER — ENSURE ENLIVE PO LIQD: 237.0000 mL | Freq: Two times a day (BID) | ORAL | Status: AC

## 2016-01-15 MED ORDER — FUROSEMIDE 40 MG PO TABS: 40.0000 mg | ORAL_TABLET | Freq: Two times a day (BID) | ORAL | Status: AC

## 2016-01-15 MED ORDER — METOPROLOL SUCCINATE ER 25 MG PO TB24: 12.5000 mg | ORAL_TABLET | Freq: Two times a day (BID) | ORAL | Status: AC

## 2016-01-15 MED ORDER — IPRATROPIUM BROMIDE 0.02 % IN SOLN: 0.5000 mg | Freq: Four times a day (QID) | RESPIRATORY_TRACT | Status: AC | PRN

## 2016-01-15 NOTE — Discharge Instructions (Signed)
°  DIET:  dyphagia 3 diet with thing liquids, cardiac diet  DISCHARGE CONDITION:  Stable  ACTIVITY:  Activity as tolerated  OXYGEN:  Home Oxygen: Yes.     Oxygen Delivery: 2 liters/min via Patient connected to nasal cannula oxygen  DISCHARGE LOCATION:  nursing home    ADDITIONAL DISCHARGE INSTRUCTION:   If you experience worsening of your admission symptoms, develop shortness of breath, life threatening emergency, suicidal or homicidal thoughts you must seek medical attention immediately by calling 911 or calling your MD immediately  if symptoms less severe.  You Must read complete instructions/literature along with all the possible adverse reactions/side effects for all the Medicines you take and that have been prescribed to you. Take any new Medicines after you have completely understood and accpet all the possible adverse reactions/side effects.   Please note  You were cared for by a hospitalist during your hospital stay. If you have any questions about your discharge medications or the care you received while you were in the hospital after you are discharged, you can call the unit and asked to speak with the hospitalist on call if the hospitalist that took care of you is not available. Once you are discharged, your primary care physician will handle any further medical issues. Please note that NO REFILLS for any discharge medications will be authorized once you are discharged, as it is imperative that you return to your primary care physician (or establish a relationship with a primary care physician if you do not have one) for your aftercare needs so that they can reassess your need for medications and monitor your lab values.

## 2016-01-15 NOTE — Progress Notes (Signed)
[facility]  ROUNDING NOTE   Subjective:   Doing fair No acute complaints. Urine output 2150 cc Serum creatinine relatively stable at 2.34   Objective:  Vital signs in last 24 hours:  Temp:  [97.5 F (36.4 C)-98.3 F (36.8 C)] 97.5 F (36.4 C) (04/26 1122) Pulse Rate:  [65-70] 65 (04/26 1122) Resp:  [18-20] 18 (04/26 1122) BP: (136-145)/(59-80) 139/63 mmHg (04/26 1122) SpO2:  [95 %-97 %] 97 % (04/26 1122)  Weight change:  Filed Weights   01/12/16 0935 01/13/16 0401 01/13/16 1014  Weight: 84.823 kg (187 lb) 85.911 kg (189 lb 6.4 oz) 86.864 kg (191 lb 8 oz)    Intake/Output: I/O last 3 completed shifts: In: 240 [P.O.:240] Out: 2850 [Urine:2850]   Intake/Output this shift:  Total I/O In: 360 [P.O.:360] Out: 450 [Urine:450]  Physical Exam: General: Sitting up in bed.   Head: Normocephalic, atraumatic. Moist oral mucosal membranes  Eyes: Anicteric,  Neck: Supple, trachea midline  Lungs:  4 [place] , Normal effort, clear anteriorly and laterally   Heart: Regular rate and rhythm  Abdomen:  Soft, nontender,   Extremities: + peripheral edema.  Neurologic: Nonfocal, moving all four extremities  Skin: No lesions  Access: none    Basic Metabolic Panel:  Recent Labs Lab 01/10/16 0758 01/11/16 0518 01/12/16 0555 01/13/16 0445 01/15/16 0847  NA 140 141 144 145 145  K 3.8 3.4* 3.3* 4.1 4.2  CL 107 106 107 107 103  CO2 GLUCOSE 99 103* 103* 117* 105*  BUN 59* 60* 57* 59* 51*  CREATININE 2.24* 2.32* 2.46* 2.44* 2.34*  CALCIUM 8.7* 8.8* 9.0 9.2 9.1    Liver Function Tests: No results for input(s): AST, ALT, ALKPHOS, BILITOT, PROT, ALBUMIN in the last 168 hours. No results for input(s): LIPASE, AMYLASE in the last 168 hours. No results for input(s): AMMONIA in the last 168 hours.  CBC:  Recent Labs Lab 01/09/16 0444 01/11/16 0518 01/13/16 0445  WBC 6.9 7.2 9.5  HGB 10.6* 10.8* 11.0*  HCT 32.6* 33.4* 32.8*  MCV 86.6 85.9 87.0   PLT 119* 148* 169    Cardiac Enzymes: No results for input(s): CKTOTAL, CKMB, CKMBINDEX, TROPONINI in the last 168 hours.  BNP: Invalid input(s): POCBNP  CBG: No results for input(s): GLUCAP in the last 168 hours.  Microbiology: Results for orders placed or performed during the hospital encounter of 01/05/16  MRSA PCR Screening     Status: None   Collection Time: 01/05/16  6:23 PM  Result Value Ref Range Status   MRSA by PCR NEGATIVE NEGATIVE Final    Comment:        The GeneXpert MRSA Assay (FDA approved for NASAL specimens only), is one component of a comprehensive MRSA colonization surveillance program. It is not intended to diagnose MRSA infection nor to guide or monitor treatment for MRSA infections.   Urine culture     Status: Abnormal (Preliminary result)   Collection Time: 01/14/16  6:38 AM  Result Value Ref Range Status   Specimen Description URINE, RANDOM  Final   Special Requests NONE  Final   Culture (A)  Final    >=100,000 COLONIES/mL GRAM NEGATIVE RODS IDENTIFICATION AND SUSCEPTIBILITIES TO FOLLOW    Report Status PENDING  Incomplete    Coagulation Studies: No results for input(s): LABPROT, INR in the last 72 hours.  Urinalysis:  Recent Labs  01/14/16 0638  COLORURINE YELLOW*  LABSPEC 1.013  PHURINE 6.0  GLUCOSEU NEGATIVE  HGBUR 1+*  BILIRUBINUR NEGATIVE  KETONESUR NEGATIVE  PROTEINUR 30*  NITRITE NEGATIVE  LEUKOCYTESUR 3+*      Imaging: No results found.   Medications:       Assessment/ Plan:  [name] is a 82 y.o.  male with hypertension, BPH, allergies, who was [facility] to [facility] on 01/05/2016    1. Acute renal failure: unknown baseline creatinine. Acute renal failure from acute cardiorenal syndrome, hypotension and poor renal perfusion. Now with fluid overload and not on diuretics. Medications renally dosed.  - Serum creatinine 2.34, urine output 2150 cc   2. Gross hematuria: also with hemoptysis: thought to be  due to foley trauma. ANCA negative His repeat urinalysis shows 3+ leukocytes and WBCs consistent with UTI Antibiotics as per primary team  3. Acute exacerbation of systolic congestive heart failure with atrial flutter and v-tach:  - Lasix to 40 mg by mouth twice a day  F/u outpatient     LOS: 10 Jermaine Fields 4/26/20174:53 PM

## 2016-01-15 NOTE — Discharge Summary (Addendum)
Jermaine Fields, 82 y.o., DOB 06/21/1930, MRN [id number]. Admission date: 01/05/2016 Discharge Date 01/15/2016 Primary MD Oswaldo ConroyBender, Abby Daneele, MD Admitting Physician Auburn BilberryShreyang Damauri Minion, MD  Admission Diagnosis  Hypokalemia [E87.6] Ventricular tachycardia [facility](HCC) [I47.2] Cardiac enzymes elevated [R74.8] Hypotension, unspecified hypotension type [I95.9]  Discharge Diagnosis   Principal Problem:   Ventricular tachycardia (HCC)   Acute respiratory failure (HCC) with hypoxia   Acute systolic CHF exacerbation (congestive heart failure) (HCC)   NSTEMI (non-ST elevated myocardial infarction) (HCC)   Acute on chronic renal insufficiency (HCC) due to acute cardio renal syndrome Gross hematuria felt to be due to Foley Hemoptysis BPH Hyperlipidemia            Hospital Course 82 year old past medical history of hypertension, BPH, presented with near syncopal episode at home as well as chest pain. Arrival to the emergency room noted to be in ventricular tachycardia with rate in 180s, he was given adenosine and metoprolol without effect, he was then electrically cardioverted back into sinus rhythm, noted to have hypoxia in the 80s, placed on nonrebreather mask and brought to ICU for close observation. Patient was seen in consultation by cardiology. They recommended medical management. In terms of this ventricular tachycardia he was started on amiodarone orally. Patient has not had any further arrhythmias. He also was noted to have acute respiratory failure due to acute CHF exasperation. Due to his acute renal failure in his diuresis was limited but now he is on oral Lasix. The patient's breathing is much improved. Patient is very weak and deconditioned need of further rehabilitation. He was also seen in consultation by nephrology for acute renal failure felt to be due to cardiorenal syndrome renal function is stable.           Consults  cardiology  Significant Tests:  See full reports for all details       Dg Chest 1 View  01/05/2016  CLINICAL DATA:  Weakness and dizziness. Larey SeatFell out of his chair this morning. EXAM: CHEST 1 VIEW COMPARISON:  None. FINDINGS: Enlarged cardiac silhouette. Prominent interstitial markings. Normal vasculature. No pleural fluid. Diffuse osteopenia. No fracture or pneumothorax seen. IMPRESSION: Cardiomegaly and chronic interstitial lung disease. Electronically Signed   By: Beckie SaltsSteven  Reid M.D.   On: 01/05/2016 10:12   Ct Head Wo Contrast  01/05/2016  CLINICAL DATA:  Altered mental status. Weakness and dizziness. Tachycardia. EXAM: CT HEAD WITHOUT CONTRAST TECHNIQUE: Contiguous axial images were obtained from the base of the skull through the vertex without intravenous contrast. COMPARISON:  Report from 10/03/2001 head CT (images not available). FINDINGS: No evidence of parenchymal hemorrhage or extra-axial fluid collection. No mass lesion, mass effect, or midline shift. No CT evidence of acute infarction. Intracranial atherosclerosis. Nonspecific mild subcortical and periventricular white matter hypodensity, most in keeping with chronic small vessel ischemic change. Small bilateral thalamic lacunar infarcts. Cerebral volume is age appropriate. No ventriculomegaly. The visualized paranasal sinuses are essentially clear. The mastoid air cells are unopacified. No evidence of calvarial fracture. IMPRESSION: 1.  No evidence of acute intracranial abnormality. 2. Intracranial atherosclerosis, mild chronic small vessel ischemia and small bilateral thalamic lacunes. Electronically Signed   By: Delbert PhenixJason A Poff M.D.   On: 01/05/2016 11:07   [place] Renal  01/06/2016  CLINICAL DATA:  Acute renal failure. EXAM: RENAL / URINARY TRACT ULTRASOUND COMPLETE COMPARISON:  None. FINDINGS: Right Kidney: Length: 12.9 cm. Echogenicity within normal limits. No mass or hydronephrosis visualized. Left Kidney: Length: 10.4 cm. Normal parenchymal echogenicity. No stone or hydronephrosis. Two cysts, 1  from the upper pole  and 1 from the midpole. Midpole cyst measures 2.2 x 1.9 x 2.1 cm. Upper pole cyst measures 1.5 x 1.3 x 1.5 cm. No other renal masses. Bladder: Decompressed by a Foley catheter. IMPRESSION: 1. Normal parenchymal echogenicity.  No hydronephrosis. 2. 2 left renal cysts which appear to be simple cysts on ultrasound. No other renal masses. Electronically Signed   By: Amie Portland M.D.   On: 01/06/2016 15:16   Dg Chest Port 1 View  01/07/2016  CLINICAL DATA:  Evaluation of respiratory distress. EXAM: PORTABLE CHEST 1 VIEW COMPARISON:  01/06/2016 FINDINGS: There is moderate cardiac enlargement. Bilateral pleural effusions are noted right greater than left. There is diffuse pulmonary edema identified. IMPRESSION: 1. CHF.  Unchanged from previous exam. Electronically Signed   By: Signa Kell M.D.   On: 01/07/2016 09:15   Dg Chest Port 1 View  01/06/2016  CLINICAL DATA:  Shortness of breath and tachycardia. Respiratory failure. EXAM: PORTABLE CHEST 1 VIEW COMPARISON:  Single view of the chest 01/05/2016 FINDINGS: Defibrillator pad remains in place. There is cardiomegaly. Extensive bilateral airspace disease appears worsened. There may be bilateral pleural effusions. IMPRESSION: Worsening bilateral airspace disease most consistent with increased pulmonary edema. Likely small to moderate bilateral pleural effusions noted. Electronically Signed   By: Drusilla Kanner M.D.   On: 01/06/2016 09:07       Today   Subjective:   Jermaine Fields  patient currently feeling better he denies any chest pain or shortness of breath.  Objective:   Blood pressure 139/63, pulse 65, temperature 97.5 F (36.4 C), temperature source Oral, resp. rate 18, height  (1.803 m), weight 86.864 kg (191 lb 8 oz), SpO2 97 %.  .  Intake/Output Summary (Last 24 hours) at 01/15/16 1606 Last data filed at 01/15/16 1004  Gross per 24 hour  Intake    360 ml  Output   1850 ml  Net  -1490 ml    Exam VITAL SIGNS: Blood pressure  139/63, pulse 65, temperature 97.5 F (36.4 C), temperature source Oral, resp. rate 18, height  (1.803 m), weight 86.864 kg (191 lb 8 oz), SpO2 97 %.  GENERAL:  82 y.o.-year-old patient lying in the bed with no acute distress.  EYES: Pupils equal, round, reactive to light and accommodation. No scleral icterus. Extraocular muscles intact.  HEENT: Head atraumatic, normocephalic. Oropharynx and nasopharynx clear.  NECK:  Supple, no jugular venous distention. No thyroid enlargement, no tenderness.  LUNGS: Normal breath sounds bilaterally, no wheezing, rales,rhonchi or crepitation. No use of accessory muscles of respiration.  CARDIOVASCULAR: S1, S2 normal. No murmurs, rubs, or gallops.  ABDOMEN: Soft, nontender, nondistended. Bowel sounds present. No organomegaly or mass.  EXTREMITIES:+1+l edema, cyanosis, or clubbing.  NEUROLOGIC: Cranial nerves II through XII are intact. Muscle strength 5/5 in all extremities. Sensation intact. Gait not checked.  PSYCHIATRIC: The patient is alert and oriented x 3.  SKIN: No obvious rash, lesion, or ulcer.   Data Review     CBC w Diff:  Lab Results  Component Value Date   WBC 9.5 01/13/2016   HGB 11.0* 01/13/2016   HCT 32.8* 01/13/2016   PLT 169 01/13/2016   LYMPHOPCT 5 01/06/2016   MONOPCT 12 01/06/2016   EOSPCT 0 01/06/2016   BASOPCT 0 01/06/2016   CMP:  Lab Results  Component Value Date   NA 145 01/15/2016   K 4.2 01/15/2016   CL 103 01/15/2016   CO2 31 01/15/2016  BUN 51* 01/15/2016   CREATININE 2.34* 01/15/2016   PROT 6.9 01/06/2016   ALBUMIN 3.1* 01/06/2016   BILITOT 0.5 01/06/2016   ALKPHOS 83 01/06/2016   AST 35 01/06/2016   ALT 38 01/06/2016  .  Micro Results Recent Results (from the past 240 hour(s))  MRSA PCR Screening     Status: None   Collection Time: 01/05/16  6:23 PM  Result Value Ref Range Status   MRSA by PCR NEGATIVE NEGATIVE Final    Comment:        The GeneXpert MRSA Assay (FDA approved for NASAL  specimens only), is one component of a comprehensive MRSA colonization surveillance program. It is not intended to diagnose MRSA infection nor to guide or monitor treatment for MRSA infections.   Urine culture     Status: Abnormal (Preliminary result)   Collection Time: 01/14/16  6:38 AM  Result Value Ref Range Status   Specimen Description URINE, RANDOM  Final   Special Requests NONE  Final   Culture (A)  Final    >=100,000 COLONIES/mL GRAM NEGATIVE RODS IDENTIFICATION AND SUSCEPTIBILITIES TO FOLLOW    Report Status PENDING  Incomplete        Code Status Orders        Start     Ordered   01/09/16 1211  Do not attempt resuscitation (DNR)   Continuous    Question Answer Comment  In the event of cardiac or respiratory ARREST Do not call a "code blue"   In the event of cardiac or respiratory ARREST Do not perform Intubation, CPR, defibrillation or ACLS   In the event of cardiac or respiratory ARREST Use medication by any route, position, wound care, and other measures to relive pain and suffering. May use oxygen, suction and manual treatment of airway obstruction as needed for comfort.   Comments nurse may pronounce      01/09/16 1210    Code Status History    Date Active Date Inactive Code Status Order ID Comments User Context   01/05/2016 12:01 PM 01/09/2016 12:10 PM Full Code [id number]  Auburn Bilberry, MD ED              Follow-up Information    Follow up with [facility] .   Specialty:  Skilled Nursing Facility   Contact information:   [address] [place] [address] [phone]      Follow up with Lamont Dowdy, MD In 2 weeks.   Specialty:  Internal Medicine   Why:  Tuesday, May 23rd at 1020am, ccs   Contact information:   [address] [place] [address] [phone]       Follow up with Dalia Heading., MD In 2 weeks.   Specialty:  Cardiology   Why:  Thursday, May 11th at 1030, ccs    Contact information:   [address] [place] [address] [phone]       Discharge Medications     Medication List    STOP taking these medications        lisinopril 40 MG tablet  Commonly known as:  PRINIVIL,ZESTRIL      TAKE these medications        amiodarone 400 MG tablet  Commonly known as:  PACERONE  Take 1 tablet (400 mg total) by mouth 2 (two) times daily.     amLODipine 10 MG tablet  Commonly known as:  NORVASC  Take 10 mg by mouth daily.  aspirin EC 81 MG tablet  Take 81 mg by mouth daily.     atorvastatin 40 MG tablet  Commonly known as:  LIPITOR  Take 1 tablet (40 mg total) by mouth daily at 6 PM.     cefUROXime 500 MG tablet  Commonly known as:  CEFTIN  Take 1 tablet (500 mg total) by mouth 2 (two) times daily with a meal.     cetirizine 10 MG tablet  Commonly known as:  ZYRTEC  Take 10 mg by mouth daily.     cloNIDine 0.1 MG tablet  Commonly known as:  CATAPRES  Take 0.1 mg by mouth 2 (two) times daily.     doxazosin 2 MG tablet  Commonly known as:  CARDURA  Take 2 mg by mouth daily.     feeding supplement (ENSURE ENLIVE) Liqd  Take 237 mLs by mouth 2 (two) times daily between meals.     fluticasone 50 MCG/ACT nasal spray  Commonly known as:  FLONASE  Place 2 sprays into both nostrils daily as needed. For sinus congestion.     furosemide 40 MG tablet  Commonly known as:  LASIX  Take 1 tablet (40 mg total) by mouth 2 (two) times daily.     ipratropium 0.02 % nebulizer solution  Commonly known as:  ATROVENT  Take 2.5 mLs (0.5 mg total) by nebulization every 6 (six) hours as needed for wheezing or shortness of breath.     levalbuterol 1.25 MG/0.5ML nebulizer solution  Commonly known as:  XOPENEX  Take 1.25 mg by nebulization every 6 (six) hours as needed for wheezing or shortness of breath.     metoprolol succinate 25 MG 24 hr tablet  Commonly known as:  TOPROL-XL  Take 0.5 tablets (12.5 mg total) by mouth 2 (two) times daily.      zolpidem 5 MG tablet  Commonly known as:  AMBIEN  Take 1 tablet (5 mg total) by mouth at bedtime.           Total Time in preparing paper work, data evaluation and todays exam - 35 minutes  Auburn Bilberry M.D on 01/15/2016 at 4:06 [facility]  [facility]   Office  [phone]

## 2016-01-15 NOTE — Clinical Social Work Note (Signed)
Patient to discharge today to go to [facility]. Patient and wife are aware and patient's wife requests transport via EMS. CSW has sent discharge information to [facility] and CSW has informed patient's nurse as well today. York SpanielMonica Aqib Lough MSW,LCSW [phone]

## 2016-01-15 NOTE — Progress Notes (Signed)
Patient will be discharged to [facility], report called to receiving nurse, iv removed, transport called, patient awaiting for transport. EMS called

## 2016-01-15 NOTE — Progress Notes (Signed)
Bedside report received, patient resting in bed at this time. Denies any pain, family at bedside, no acute distress noted

## 2016-01-15 NOTE — Clinical Social Work Placement (Signed)
   CLINICAL SOCIAL WORK PLACEMENT  NOTE  Date:  01/15/2016  Patient Details  Name: Jermaine Fields MRN: [id number] Date of Birth: 04/12/1930  Clinical Social Work is seeking post-discharge placement for this patient at the Skilled  Nursing Facility level of care (*CSW will initial, date and re-position this form in  chart as items are completed):  Yes   Patient/family provided with [facility] Clinical Social Work Department's list of facilities offering this level of care within the geographic area requested by the patient (or if unable, by the patient's family).  Yes   Patient/family informed of their freedom to choose among providers that offer the needed level of care, that participate in Medicare, Medicaid or managed care program needed by the patient, have an available bed and are willing to accept the patient.  Yes   Patient/family informed of [facility] ownership interest [facility] and [facility], as well as of the fact that they are under no obligation to receive care at these facilities.  PASRR submitted to EDS on 01/13/16     PASRR number received on 01/13/16     Existing PASRR number confirmed on       FL2 transmitted to all facilities in geographic area requested by pt/family on       FL2 transmitted to all facilities within larger geographic area on       Patient informed that his/her managed care company has contracts with or will negotiate with certain facilities, including the following:        Yes   Patient/family informed of bed offers received.  Patient chooses bed at  [facility])     Physician recommends and patient chooses bed at  [facility](SNF)    Patient to be transferred to  [facility]) on  .  Patient to be transferred to facility by  (EMS)     Patient family notified on 01/15/16 of transfer.  Name of family member notified:   (Wife)     PHYSICIAN       Additional Comment:     _______________________________________________ York SpanielMonica Ilhan Madan, LCSW 01/15/2016, 10:07 AM

## 2016-01-15 NOTE — Clinical Social Work Note (Signed)
Joseph with [facility] called CSW at 4pm to state that patient appeared to have a UTI and asked if patient needed to be on an abx. CSW contacted Dr. Allena KatzPatel and informed him of [facility]' question. Dr. Allena KatzPatel stated he needed to add an antibiotic, ceftin. CSW informed Dr. Allena KatzPatel that he needed to update the discharge summary. CSW has [facility]ayed this to [place] [facility]. York SpanielMonica Shaniquia Brafford MSW,LCSW [phone]

## 2016-01-16 LAB — URINE CULTURE: Culture: >=100000 — AB

## 2016-02-20 DEATH — deceased

## 2016-02-25 ENCOUNTER — Ambulatory Visit: Payer: Self-pay | Admitting: Urology

## 2016-02-25 ENCOUNTER — Encounter: Payer: Self-pay | Admitting: Urology

## 2017-03-28 IMAGING — CT CT HEAD W/O CM
1 series · 16 of 29 positions shown, 20 images · non-contrast
Comparison: Report from 10/03/2001 head CT (images not available).

CLINICAL DATA: Altered mental status. Weakness and dizziness.
Tachycardia.

EXAM:
CT HEAD WITHOUT CONTRAST
TECHNIQUE: Contiguous axial images were obtained from the base of the skull
through the vertex without intravenous contrast.

[Series 6: head wo · axial · 0.43mm/px · z∈[-74,+43]mm · 16 of 29 slices shown, 20 images]
[im 2/29  brain]
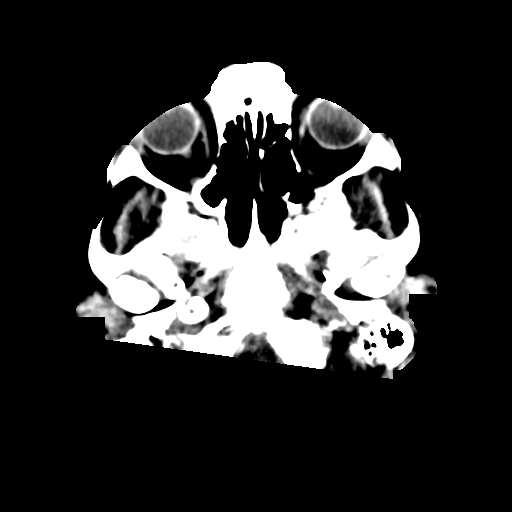
[im 2/29  bone]
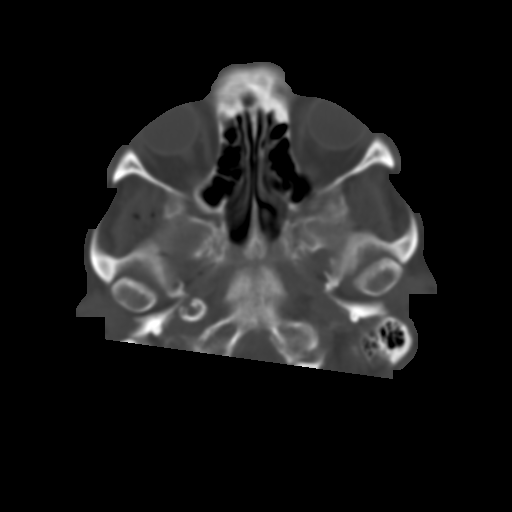
[im 4/29  brain]
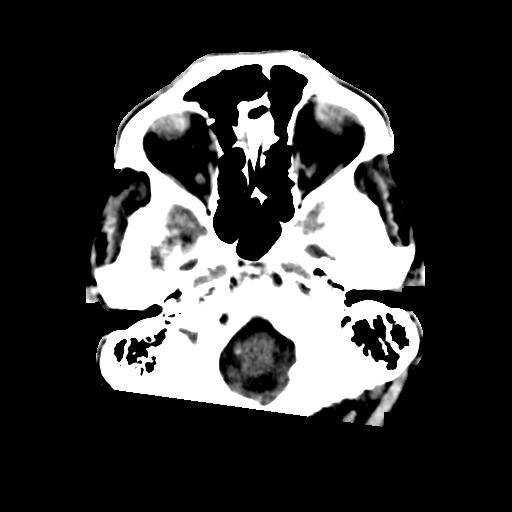
[im 6/29  brain]
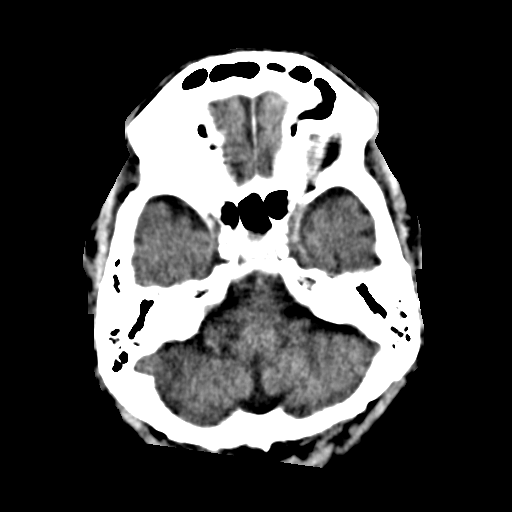
[im 7/29  brain]
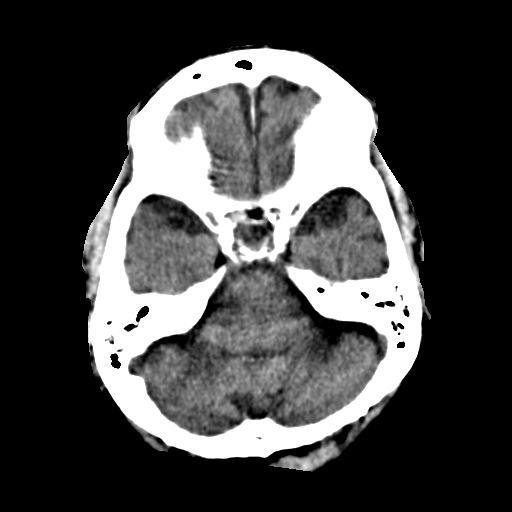
[im 9/29  brain]
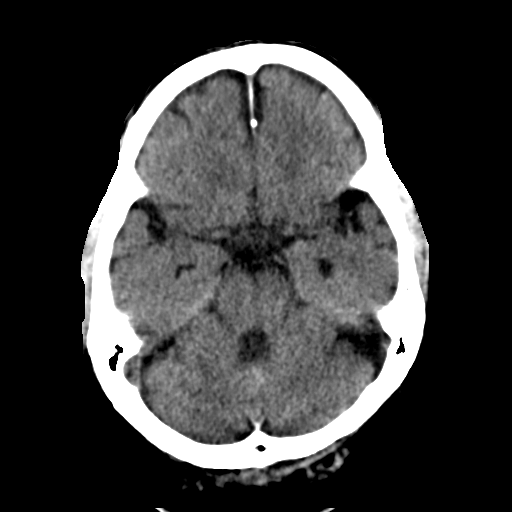
[im 9/29  bone]
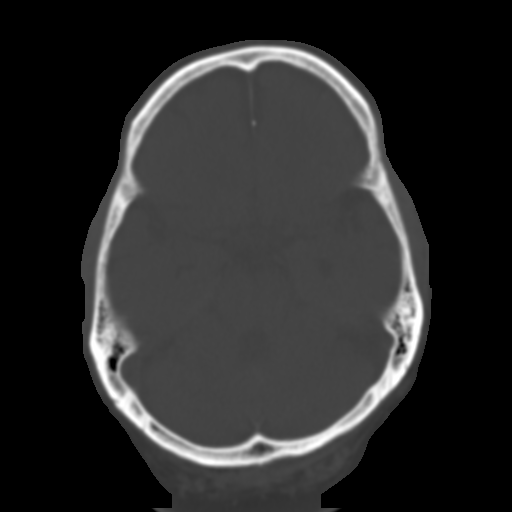
[im 11/29  brain]
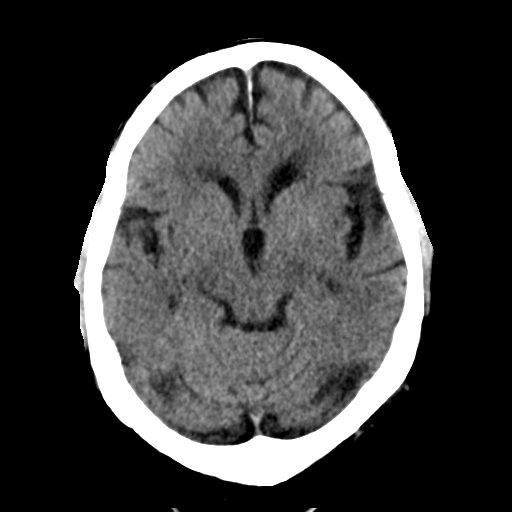
[im 12/29  brain]
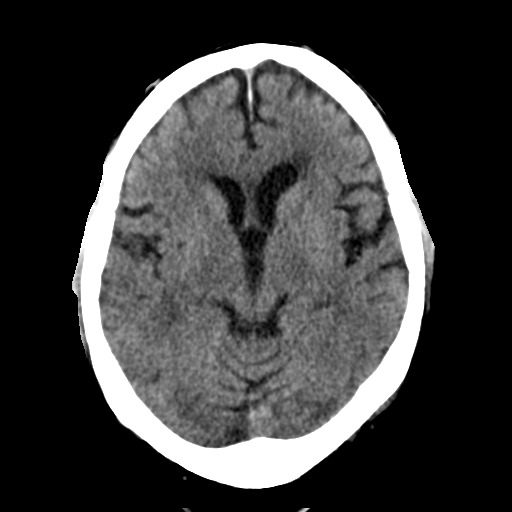
[im 14/29  brain]
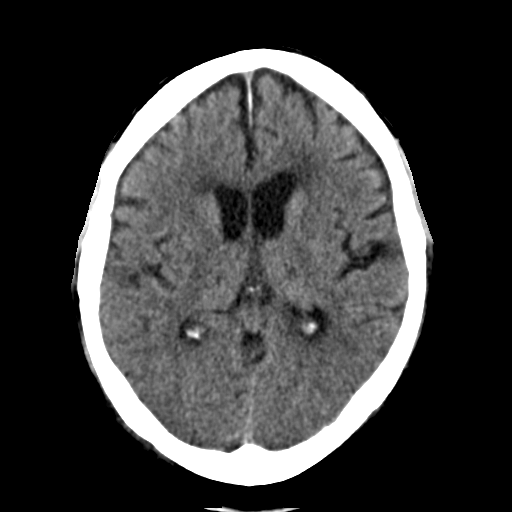
[im 16/29  brain]
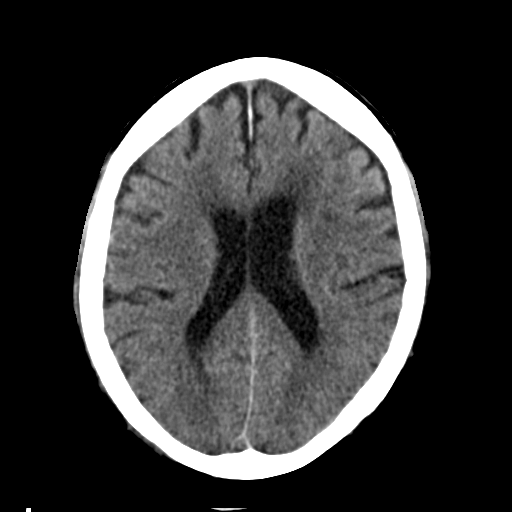
[im 16/29  bone]
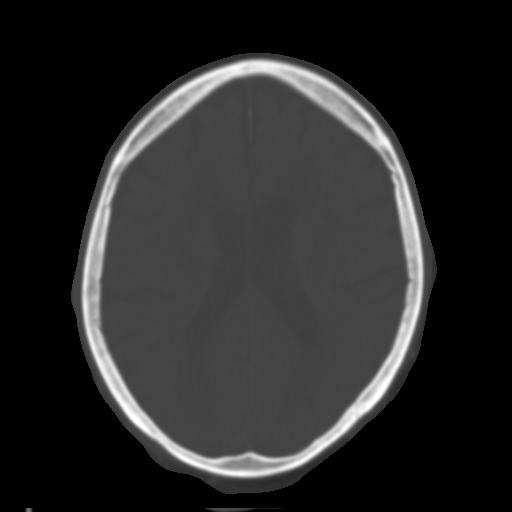
[im 18/29  brain]
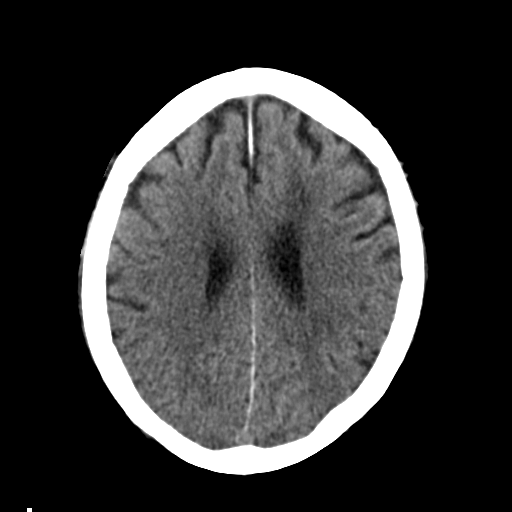
[im 19/29  brain]
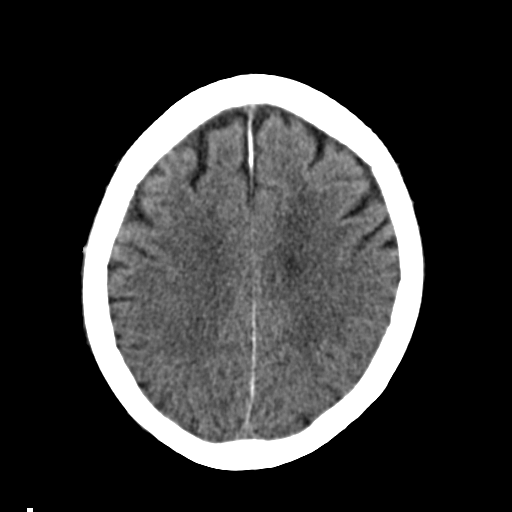
[im 21/29  brain]
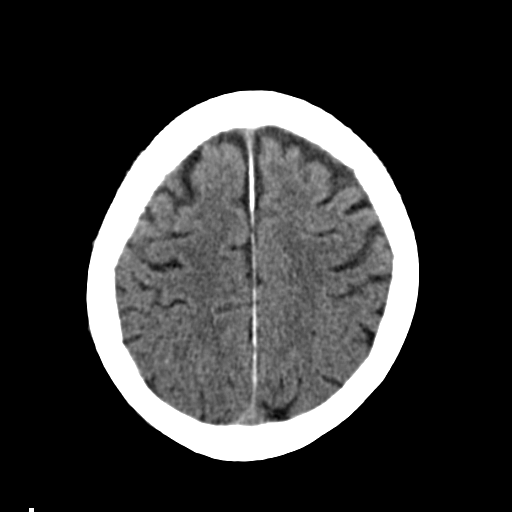
[im 23/29  brain]
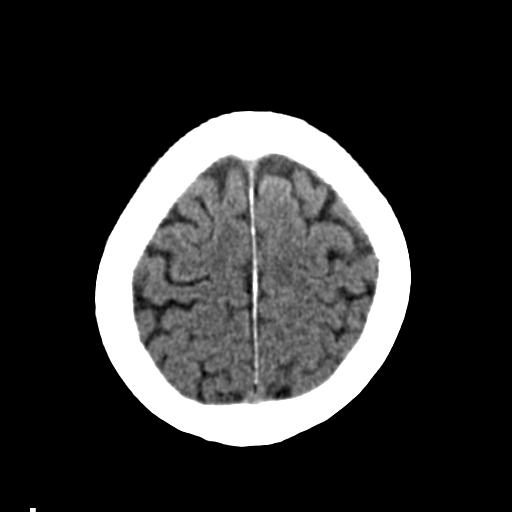
[im 23/29  bone]
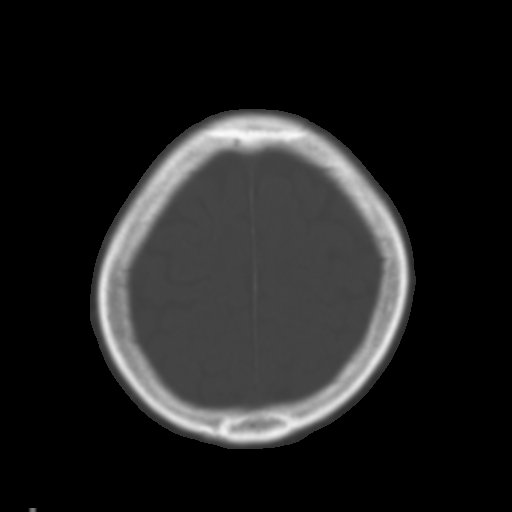
[im 24/29  brain]
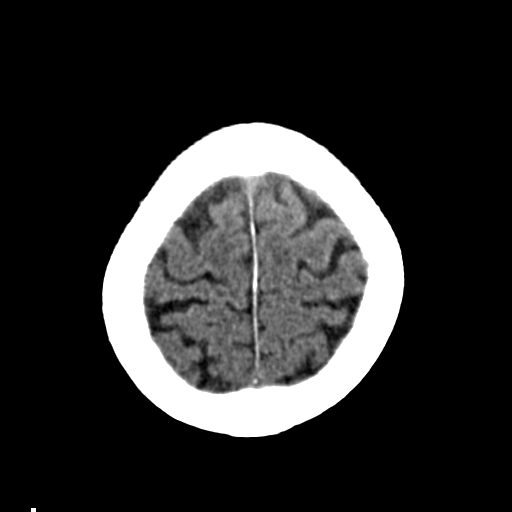
[im 26/29  brain]
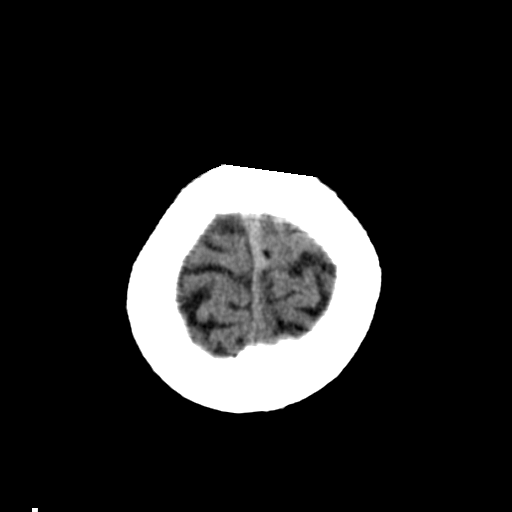
[im 28/29  brain]
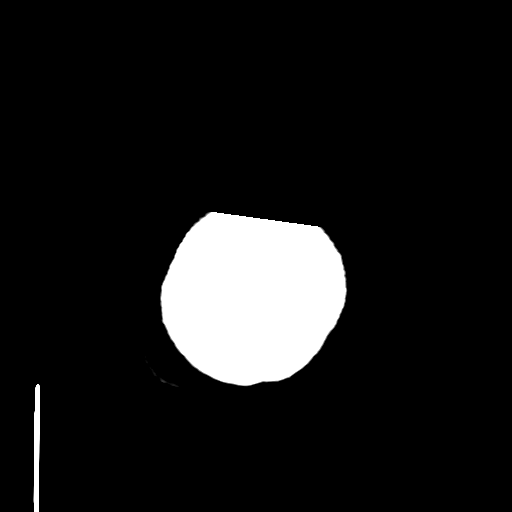

[16 of 29 positions shown; findings below may reference images not displayed]

FINDINGS: No evidence of parenchymal hemorrhage or extra-axial fluid
collection. No mass lesion, mass effect, or midline shift.

No CT evidence of acute infarction. Intracranial atherosclerosis.
Nonspecific mild subcortical and periventricular white matter
hypodensity, most in keeping with chronic small vessel ischemic
change. Small bilateral thalamic lacunar infarcts.

Cerebral volume is age appropriate. No ventriculomegaly.

The visualized paranasal sinuses are essentially clear. The mastoid
air cells are unopacified. No evidence of calvarial fracture.
IMPRESSION: 1.  No evidence of acute intracranial abnormality.
2. Intracranial atherosclerosis, mild chronic small vessel ischemia
and small bilateral thalamic lacunes.

## 2017-03-29 IMAGING — DX DG CHEST 1V PORT
1 series · 1 of 1 positions shown · non-contrast
Comparison: Single view of the chest 01/05/2016

CLINICAL DATA: Shortness of breath and tachycardia. Respiratory
failure.

EXAM:
PORTABLE CHEST 1 VIEW

[chest ap]
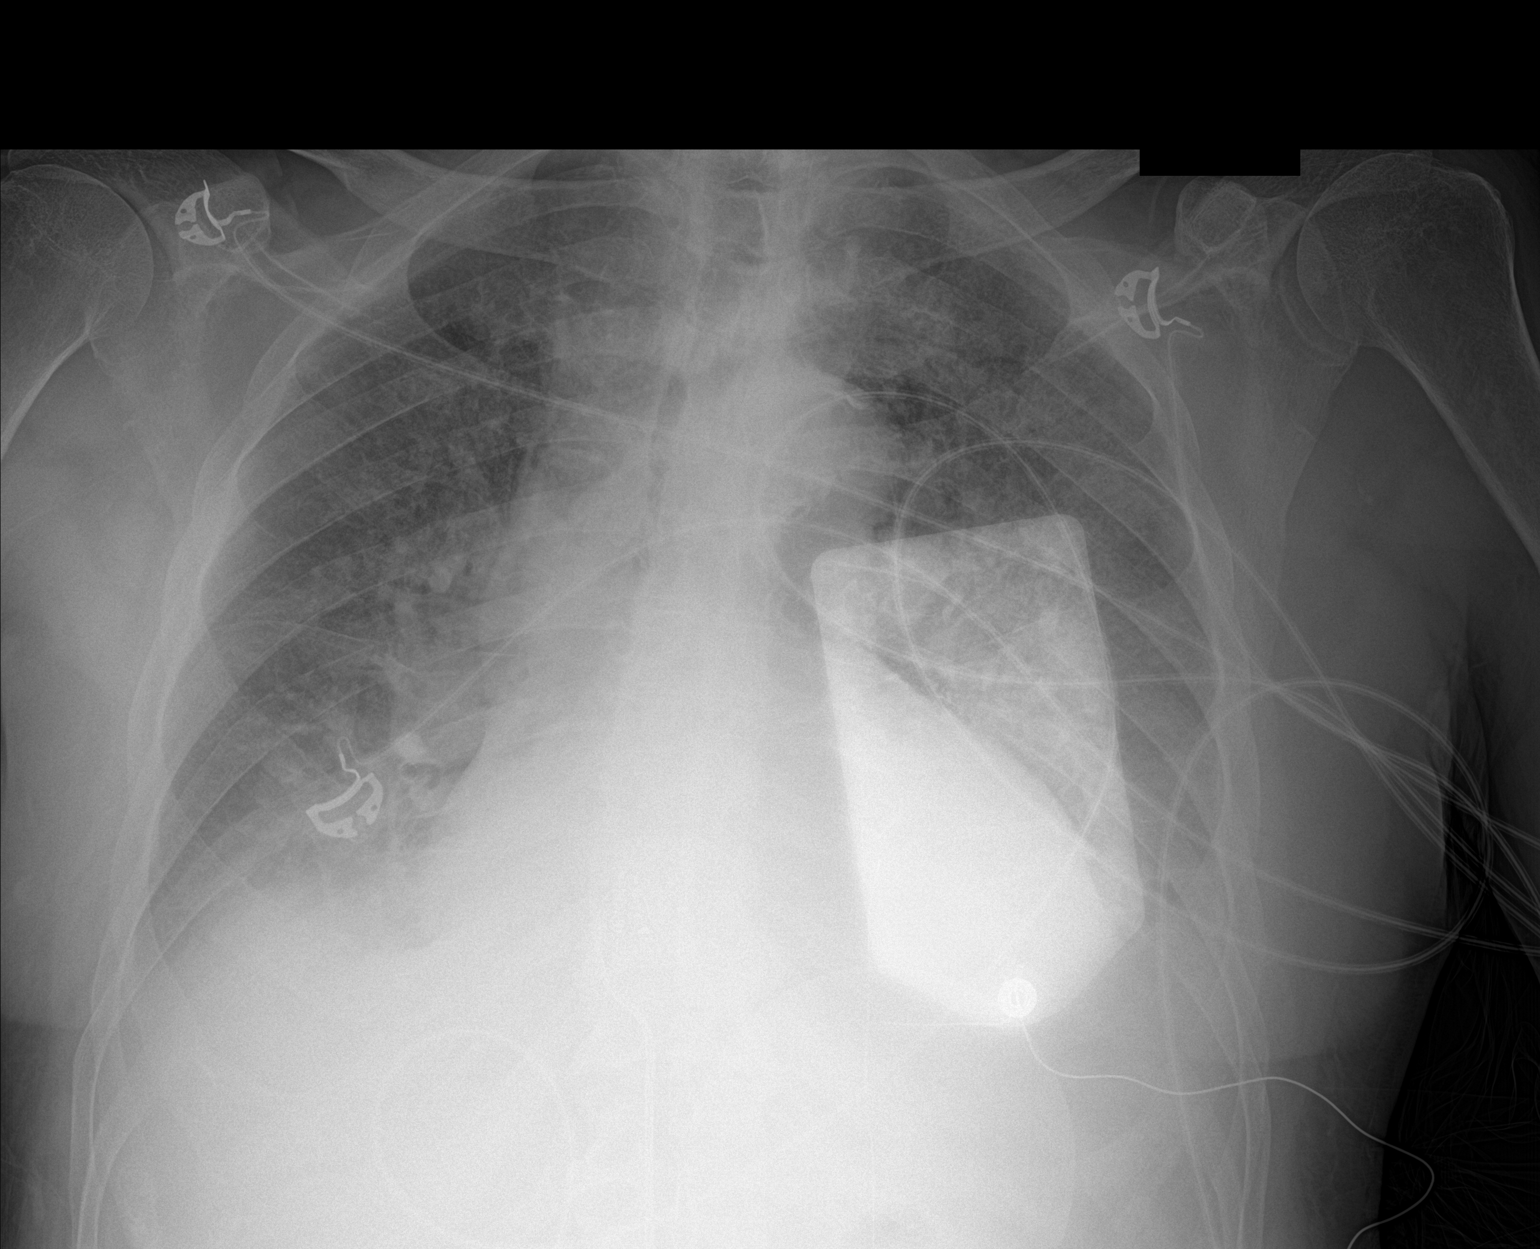

[1 of 1 positions shown; findings below may reference images not displayed]

FINDINGS: Defibrillator pad remains in place. There is cardiomegaly. Extensive
bilateral airspace disease appears worsened. There may be bilateral
pleural effusions.
IMPRESSION: Worsening bilateral airspace disease most consistent with increased
pulmonary edema. Likely small to moderate bilateral pleural
effusions noted.

## 2017-03-30 IMAGING — DX DG CHEST 1V PORT
1 series · 1 of 1 positions shown · non-contrast
Comparison: 01/06/2016

CLINICAL DATA: Evaluation of respiratory distress.

EXAM:
PORTABLE CHEST 1 VIEW

[chest ap]
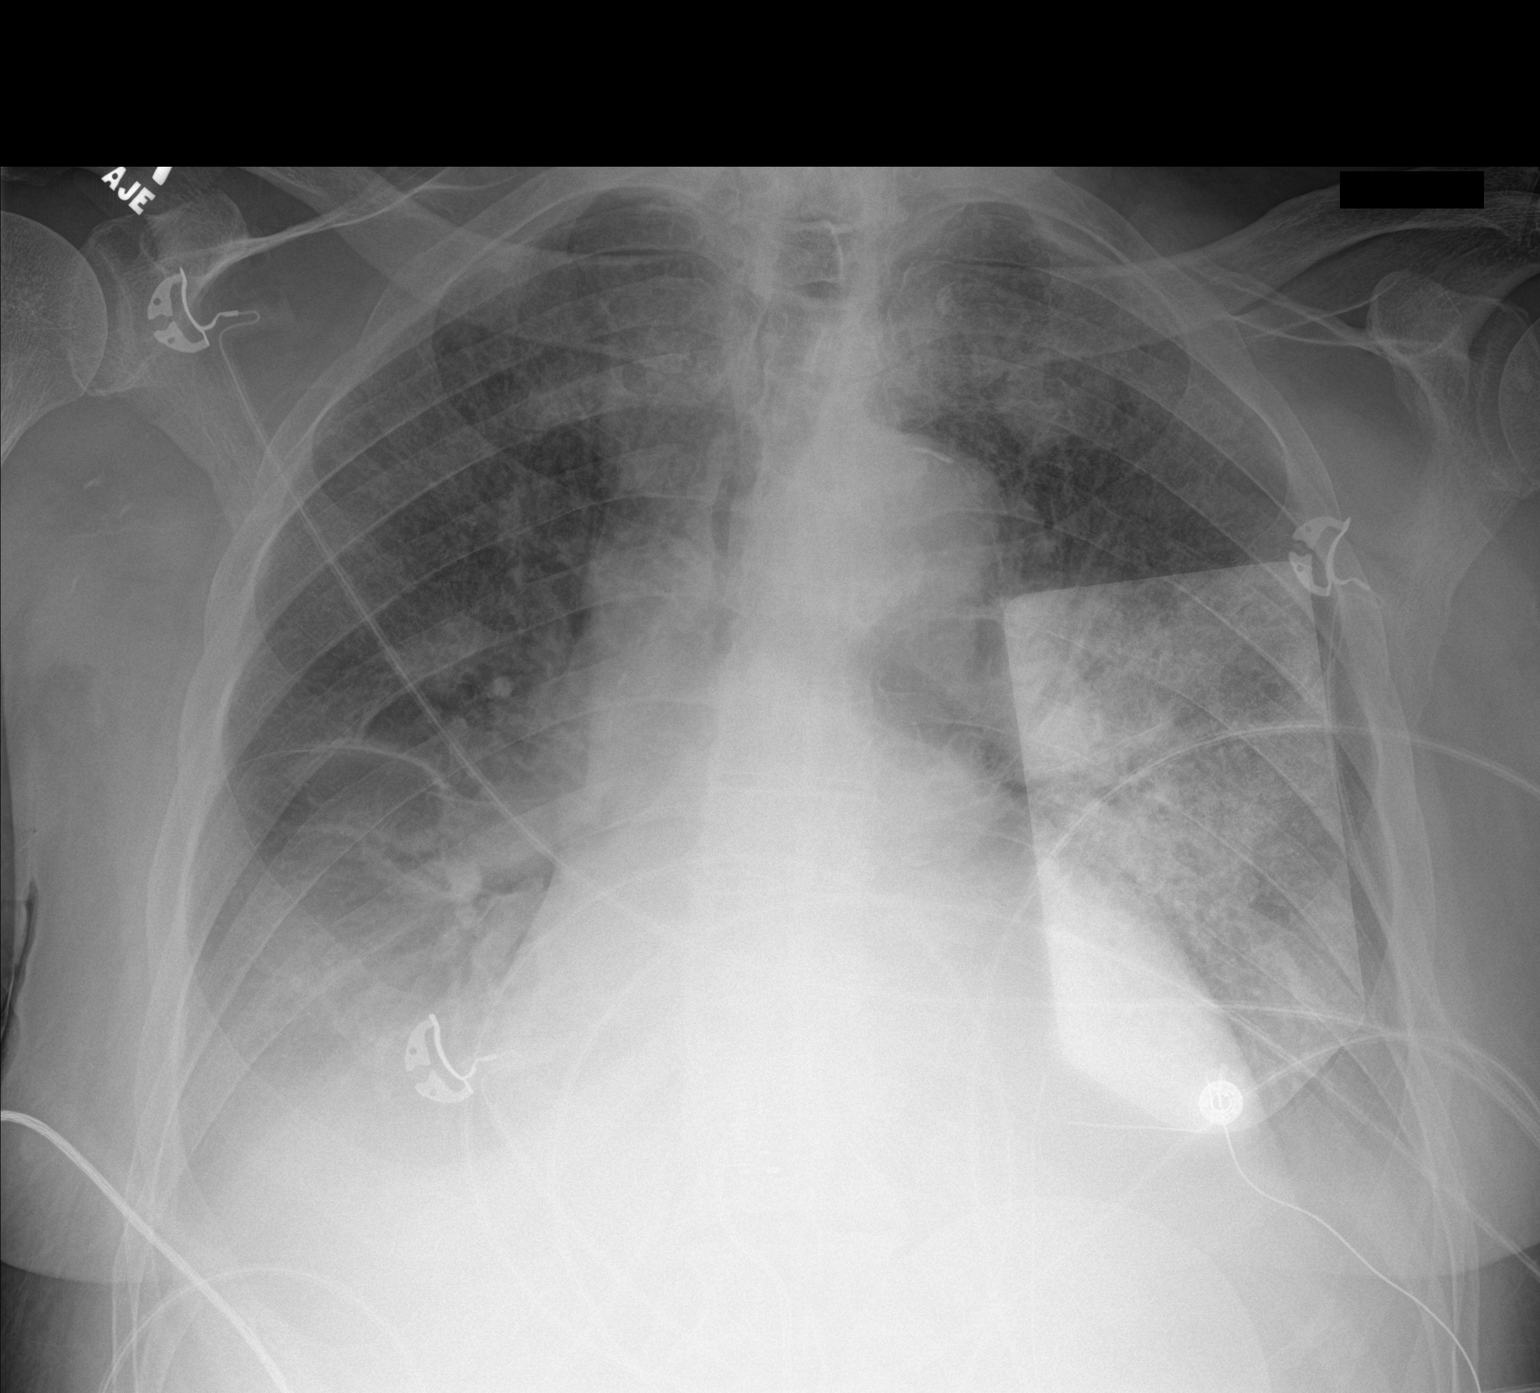

[1 of 1 positions shown; findings below may reference images not displayed]

FINDINGS: There is moderate cardiac enlargement. Bilateral pleural effusions
are noted right greater than left. There is diffuse pulmonary edema
identified.
IMPRESSION: 1. CHF.  Unchanged from previous exam.

## 2018-03-29 IMAGING — US US RENAL
1 series · 14 of 25 positions shown · non-contrast
Comparison: None.

CLINICAL DATA: Acute renal failure.

EXAM:
RENAL / URINARY TRACT ULTRASOUND COMPLETE

[Series 1: us renal · 0.29mm/px · 14 of 28 slices shown]
[im 1/28]
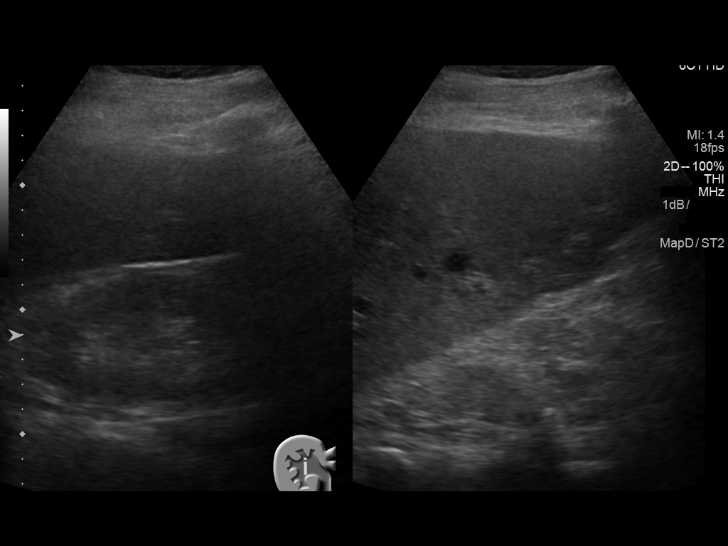
[im 3/28]
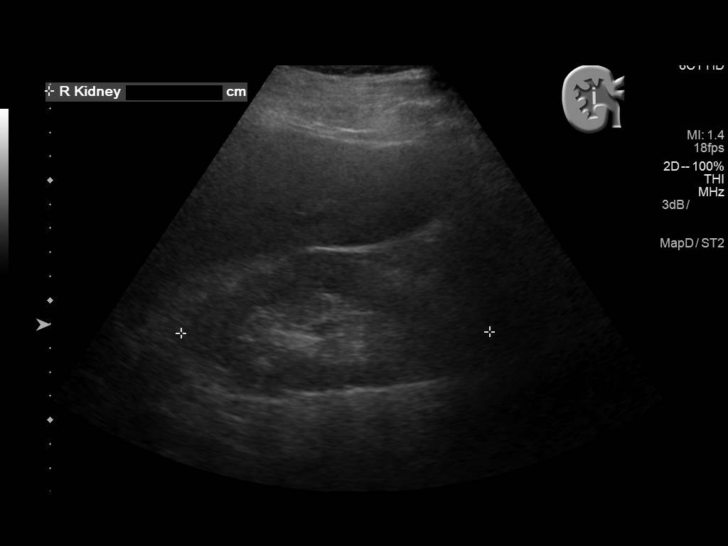
[im 5/28]
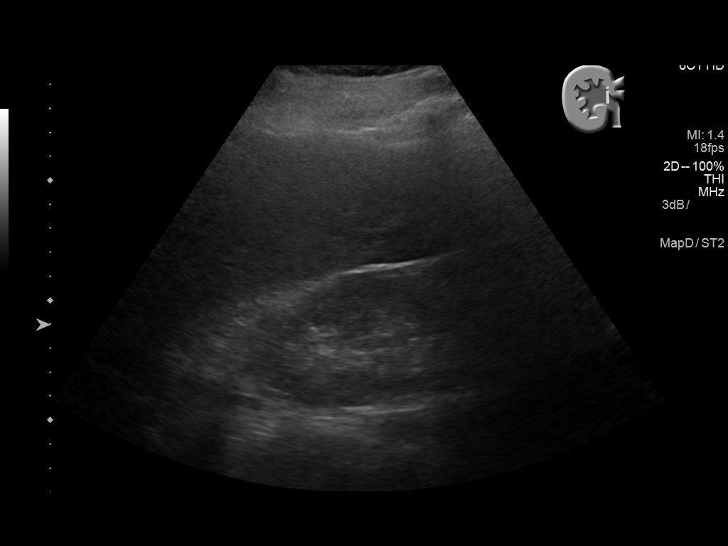
[im 7/28]
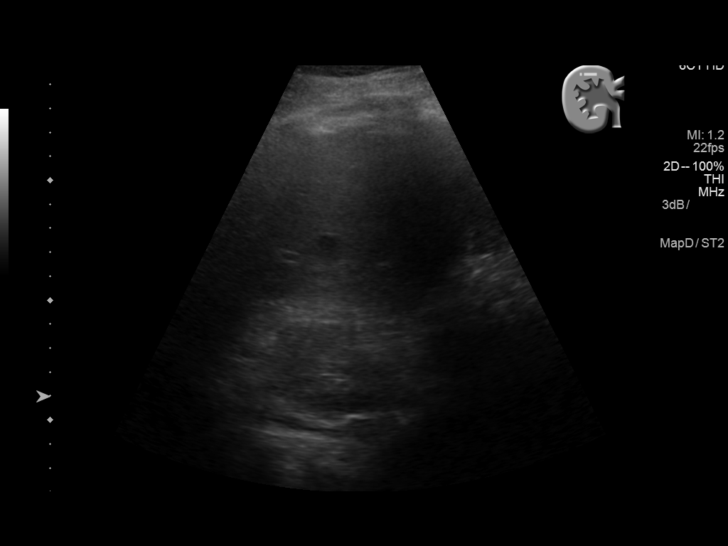
[im 10/28]
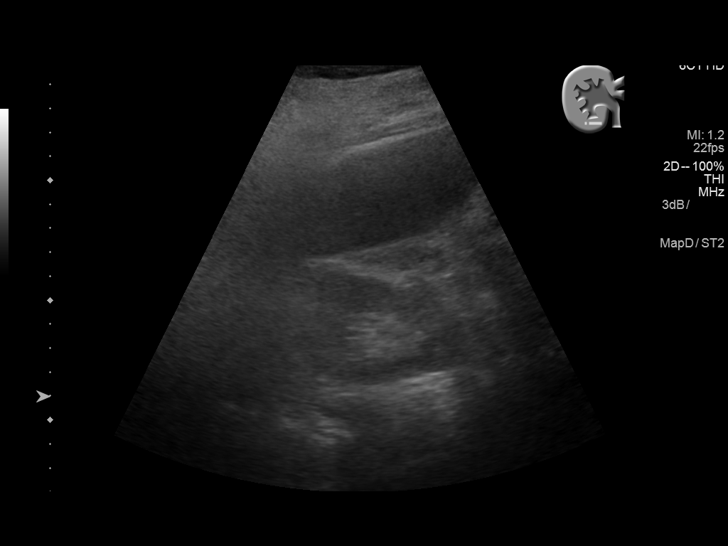
[im 11/28]
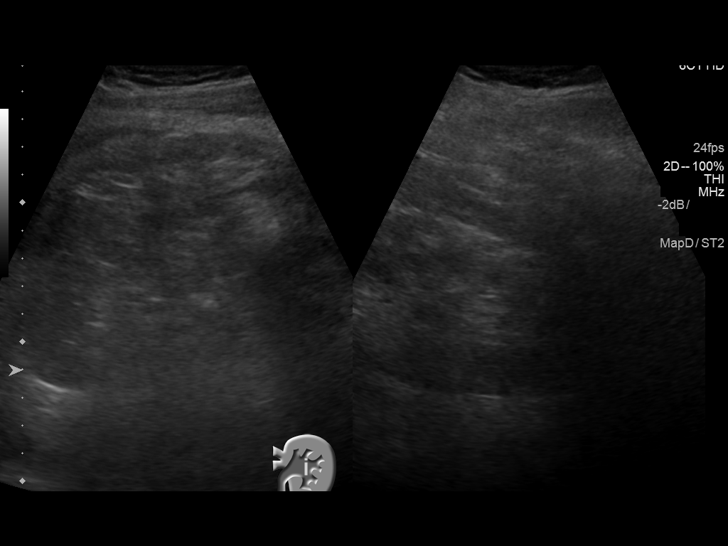
[im 13/28]
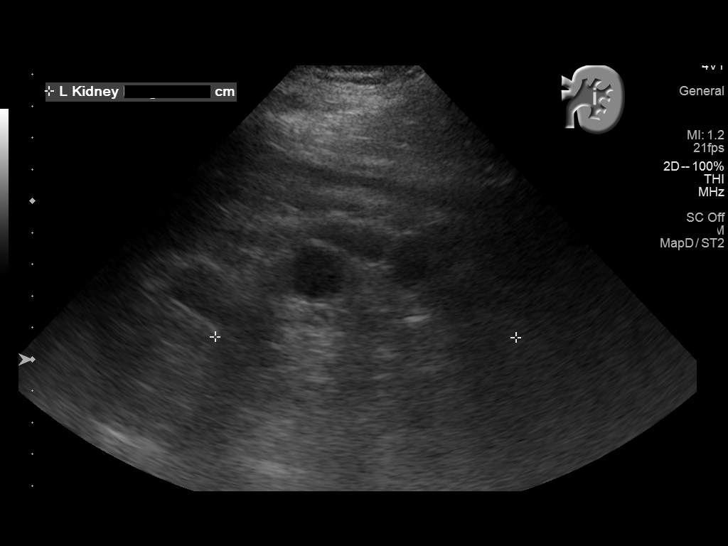
[im 15/28]
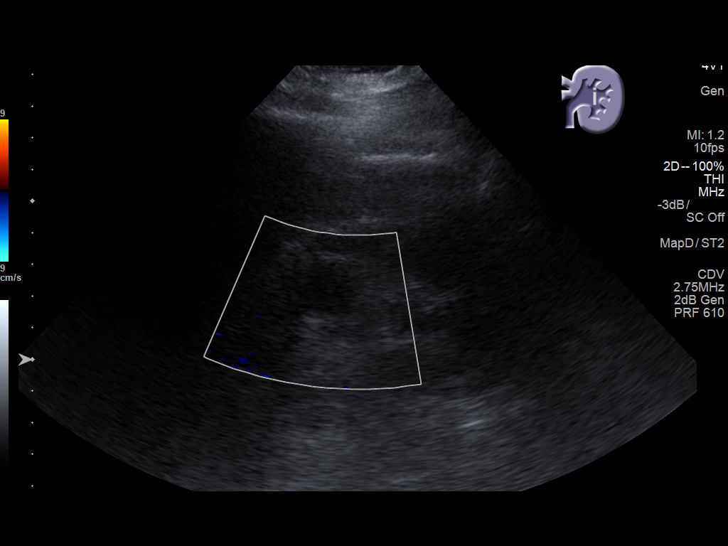
[im 17/28]
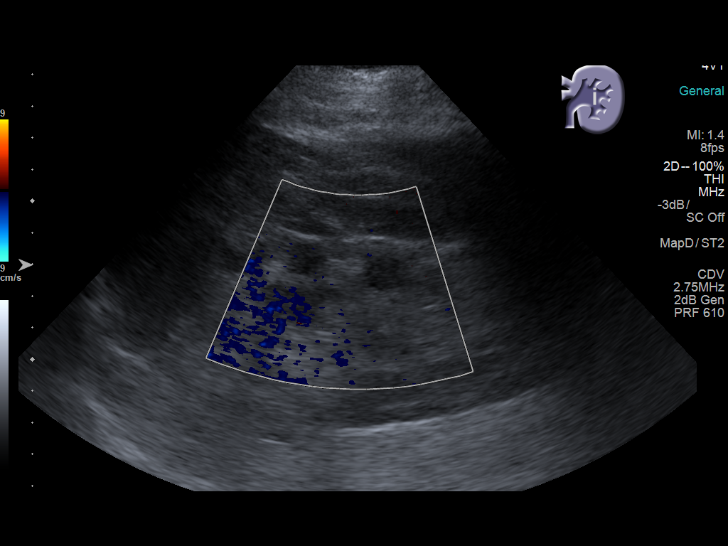
[im 19/28]
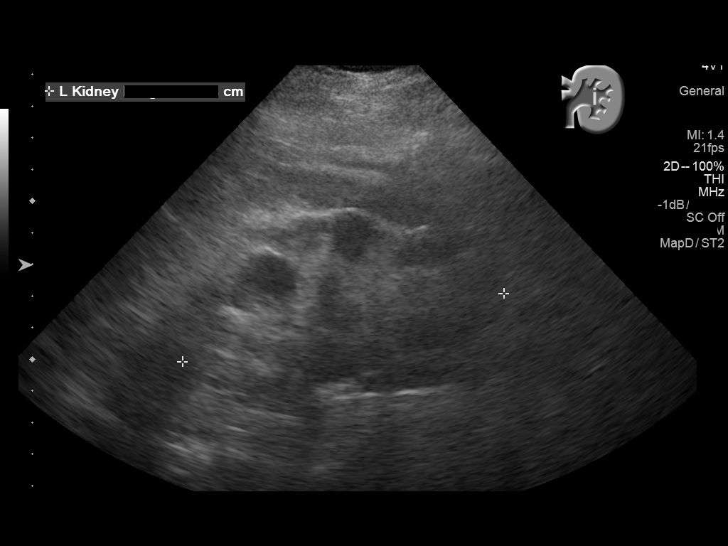
[im 21/28]
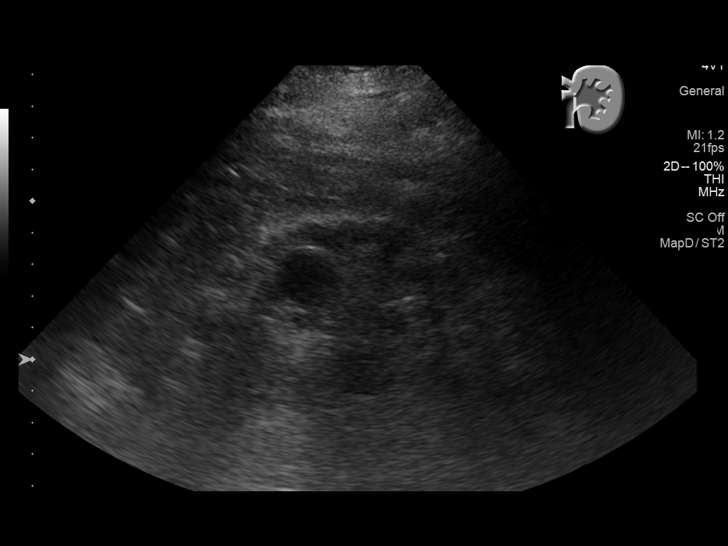
[im 23/28]
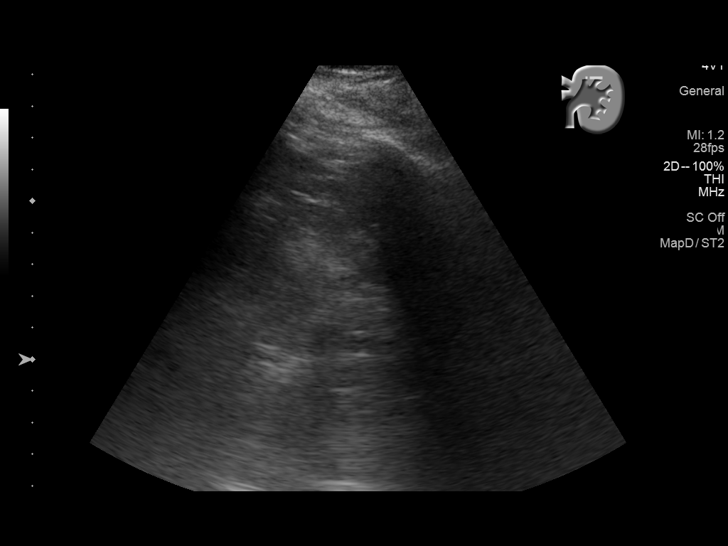
[im 25/28]
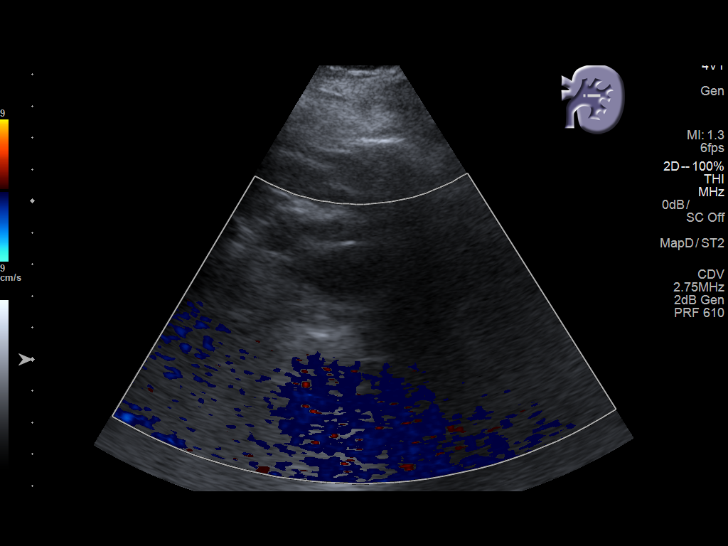
[im 28/28]
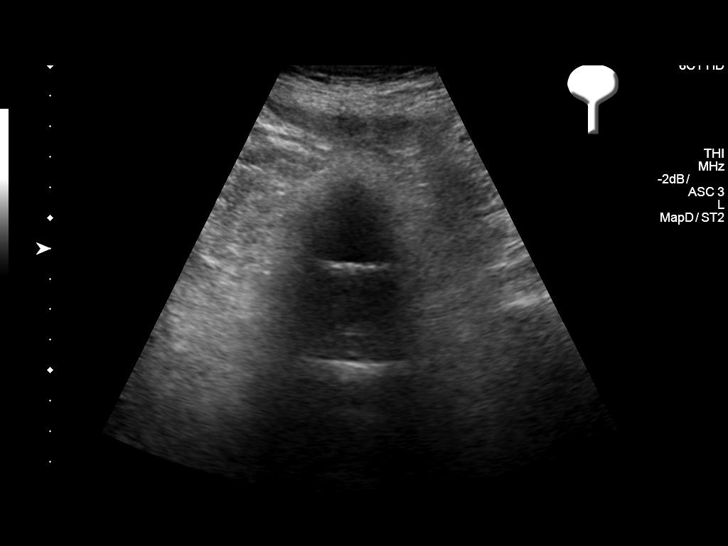

[14 of 25 positions shown; findings below may reference images not displayed]

FINDINGS: Right Kidney:

Length: 12.9 cm. Echogenicity within normal limits. No mass or
hydronephrosis visualized.

Left Kidney:

Length: 10.4 cm. Normal parenchymal echogenicity. No stone or
hydronephrosis. Two cysts, 1 from the upper pole and 1 from the
midpole. Midpole cyst measures 2.2 x 1.9 x 2.1 cm. Upper pole cyst
measures 1.5 x 1.3 x 1.5 cm. No other renal masses.

Bladder:

Decompressed by a Foley catheter.
IMPRESSION: 1. Normal parenchymal echogenicity.  No hydronephrosis.
2. 2 left renal cysts which appear to be simple cysts on ultrasound.
No other renal masses.
